# Patient Record
Sex: Female | Born: 1940
Health system: Southern US, Community
[De-identification: ages and names within clinical notes are randomized; demographics above are authoritative.]

## PROBLEM LIST (undated history)

## (undated) DIAGNOSIS — D649 Anemia, unspecified: Secondary | ICD-10-CM

## (undated) DIAGNOSIS — N6019 Diffuse cystic mastopathy of unspecified breast: Secondary | ICD-10-CM

## (undated) DIAGNOSIS — E785 Hyperlipidemia, unspecified: Secondary | ICD-10-CM

## (undated) DIAGNOSIS — D369 Benign neoplasm, unspecified site: Secondary | ICD-10-CM

## (undated) DIAGNOSIS — H269 Unspecified cataract: Secondary | ICD-10-CM

## (undated) HISTORY — DX: Hyperlipidemia, unspecified: E78.5

## (undated) HISTORY — DX: Diffuse cystic mastopathy of unspecified breast: N60.19

## (undated) HISTORY — DX: Benign neoplasm, unspecified site: D36.9

## (undated) HISTORY — PX: ABDOMINAL HYSTERECTOMY: SHX81

## (undated) HISTORY — DX: Anemia, unspecified: D64.9

## (undated) HISTORY — PX: POLYPECTOMY: SHX149

## (undated) HISTORY — PX: BREAST BIOPSY: SHX20

## (undated) HISTORY — PX: EYE SURGERY: SHX253

## (undated) HISTORY — PX: COLONOSCOPY: SHX174

## (undated) HISTORY — DX: Unspecified cataract: H26.9

---

## 2011-12-29 ENCOUNTER — Ambulatory Visit (INDEPENDENT_AMBULATORY_CARE_PROVIDER_SITE_OTHER): Payer: Managed Care, Other (non HMO) | Admitting: Internal Medicine

## 2011-12-29 ENCOUNTER — Encounter: Payer: Self-pay | Admitting: Internal Medicine

## 2011-12-29 VITALS — BP 124/70 | HR 79 | Temp 98.2°F | Ht 63.0 in | Wt 138.0 lb

## 2011-12-29 DIAGNOSIS — Z780 Asymptomatic menopausal state: Secondary | ICD-10-CM

## 2011-12-29 DIAGNOSIS — D369 Benign neoplasm, unspecified site: Secondary | ICD-10-CM | POA: Insufficient documentation

## 2011-12-29 DIAGNOSIS — E785 Hyperlipidemia, unspecified: Secondary | ICD-10-CM | POA: Insufficient documentation

## 2011-12-29 DIAGNOSIS — N951 Menopausal and female climacteric states: Secondary | ICD-10-CM

## 2011-12-29 DIAGNOSIS — N6019 Diffuse cystic mastopathy of unspecified breast: Secondary | ICD-10-CM | POA: Insufficient documentation

## 2011-12-29 DIAGNOSIS — D649 Anemia, unspecified: Secondary | ICD-10-CM | POA: Insufficient documentation

## 2011-12-29 LAB — CBC WITH DIFFERENTIAL/PLATELET
Basophils Relative: 1 % (ref 0–1)
Eosinophils Absolute: 0.1 10*3/uL (ref 0.0–0.7)
Hemoglobin: 12 g/dL (ref 12.0–15.0)
MCH: 29.6 pg (ref 26.0–34.0)
MCHC: 31.7 g/dL (ref 30.0–36.0)
Monocytes Relative: 8 % (ref 3–12)
Neutro Abs: 2.6 10*3/uL (ref 1.7–7.7)
Neutrophils Relative %: 46 % (ref 43–77)
Platelets: 290 10*3/uL (ref 150–400)
RBC: 4.06 MIL/uL (ref 3.87–5.11)

## 2011-12-29 LAB — COMPREHENSIVE METABOLIC PANEL
ALT: 11 U/L (ref 0–35)
AST: 20 U/L (ref 0–37)
Albumin: 4.7 g/dL (ref 3.5–5.2)
Alkaline Phosphatase: 44 U/L (ref 39–117)
Glucose, Bld: 90 mg/dL (ref 70–99)
Potassium: 4.4 mEq/L (ref 3.5–5.3)
Sodium: 139 mEq/L (ref 135–145)
Total Bilirubin: 0.3 mg/dL (ref 0.3–1.2)
Total Protein: 6.8 g/dL (ref 6.0–8.3)

## 2011-12-29 LAB — LIPID PANEL
LDL Cholesterol: 136 mg/dL — ABNORMAL HIGH (ref 0–99)
Triglycerides: 93 mg/dL (ref <150)
VLDL: 19 mg/dL (ref 0–40)

## 2011-12-29 LAB — TSH: TSH: 2.985 u[IU]/mL (ref 0.350–4.500)

## 2011-12-29 NOTE — Patient Instructions (Signed)
Schedule CPE with me labs will be mailed to you

## 2011-12-29 NOTE — Progress Notes (Signed)
  Subjective:    Patient ID: Kirsten Sullivan, female    DOB: August 21, 1941, 71 y.o.   MRN: [id number]  HPI  New pt. Here for first visit.  Moved recently from [facility].  Former care Dr. Montez Morita in [place]  PMH  Mild anemia, hyperlipidemia, fibrocystic breast disease S/P benign breast bx R breast.  She also had a colonoscopy 07/2011 and was found to have adenomatous polyp and will need repeat in 2015.  She is S/P hysterectomy for fibroids.   Overall healthy  No Known Allergies Past Medical History  Diagnosis Date  . Fibrocystic breast   . Anemia   . Hyperlipidemia   . Adenomatous polyps     colonoscopy 07/07/2011 Dr. Hurman Horn, [place]   Past Surgical History  Procedure Date  . Abdominal hysterectomy   . Breast biopsy     right- benign   History   Social History  . Marital Status: Single    Spouse Name: N/A    Number of Children: N/A  . Years of Education: N/A   Occupational History  . Not on file.   Social History Main Topics  . Smoking status: Never Smoker   . Smokeless tobacco: Never Used  . Alcohol Use: No  . Drug Use: No  . Sexually Active: Not Currently   Other Topics Concern  . Not on file   Social History Narrative  . No narrative on file   Family History  Problem Relation Age of Onset  . Alzheimer's disease Mother   . Prostate cancer Father   . Macular degeneration Father   . Gout Maternal Grandmother   . Heart disease Maternal Grandfather 69   Patient Active Problem List  Diagnoses  . Anemia  . Fibrocystic breast  . Hyperlipidemia  . Adenomatous polyps   No current outpatient prescriptions on file prior to visit.     Review of Systems See HPI    Objective:   Physical Exam Physical Exam  Nursing note and vitals reviewed.  Constitutional: She is oriented to person, place, and time. She appears well-developed and well-nourished.  HENT:  Head: Normocephalic and atraumatic.  Cardiovascular: Normal rate and regular rhythm. Exam reveals  no gallop and no friction rub.  No murmur heard.  Pulmonary/Chest: Breath sounds normal. She has no wheezes. She has no rales.  Neurological: She is alert and oriented to person, place, and time.  Skin: Skin is warm and dry.  Psychiatric: She has a normal mood and affect. Her behavior is normal.         Assessment & Plan:  1)  Anemia  Will recheck today 2)   Hyperlipidemia will recheck today 2)  Fibrocystic breast disease 3)  Colon adenomas  She is to schedule CPe  Will check labs today

## 2011-12-30 LAB — VITAMIN D 25 HYDROXY (VIT D DEFICIENCY, FRACTURES): Vit D, 25-Hydroxy: 40 ng/mL (ref 30–89)

## 2012-01-02 ENCOUNTER — Encounter: Payer: Self-pay | Admitting: Emergency Medicine

## 2012-01-15 ENCOUNTER — Encounter: Payer: Self-pay | Admitting: Internal Medicine

## 2012-02-21 ENCOUNTER — Ambulatory Visit (INDEPENDENT_AMBULATORY_CARE_PROVIDER_SITE_OTHER): Payer: Managed Care, Other (non HMO) | Admitting: Internal Medicine

## 2012-02-21 ENCOUNTER — Encounter: Payer: Self-pay | Admitting: Internal Medicine

## 2012-02-21 VITALS — BP 100/57 | HR 70 | Temp 99.2°F | Ht 63.25 in | Wt 139.5 lb

## 2012-02-21 DIAGNOSIS — E785 Hyperlipidemia, unspecified: Secondary | ICD-10-CM

## 2012-02-21 DIAGNOSIS — N951 Menopausal and female climacteric states: Secondary | ICD-10-CM

## 2012-02-21 DIAGNOSIS — Z780 Asymptomatic menopausal state: Secondary | ICD-10-CM

## 2012-02-21 DIAGNOSIS — Z0000 Encounter for general adult medical examination without abnormal findings: Secondary | ICD-10-CM

## 2012-02-21 DIAGNOSIS — I8390 Asymptomatic varicose veins of unspecified lower extremity: Secondary | ICD-10-CM

## 2012-02-21 DIAGNOSIS — Z23 Encounter for immunization: Secondary | ICD-10-CM

## 2012-02-21 LAB — POCT URINALYSIS DIPSTICK
Glucose, UA: NEGATIVE
Nitrite, UA: NEGATIVE
Protein, UA: NEGATIVE
Spec Grav, UA: 1.015
Urobilinogen, UA: 0.2

## 2012-02-21 MED ORDER — TETANUS-DIPHTH-ACELL PERTUSSIS 5-2.5-18.5 LF-MCG/0.5 IM SUSP
0.5000 mL | Freq: Once | INTRAMUSCULAR | Status: AC
  Administered 2012-02-21: 0.5 mL via INTRAMUSCULAR

## 2012-02-21 NOTE — Progress Notes (Signed)
Subjective:    Patient ID: Kirsten Sullivan, female    DOB: Mar 30, 1941, 71 y.o.   MRN: [id number]  HPI  Kirsten Sullivan is here for CPE.  She is doing well.  She does report that she has a bloodshot R eye a few days ago but has resolved now.   Happened upon awakening no pain no visual change.  She has not seen an opthalmolgist here as yet.  No visual problems now  Has not had a Tetanus in many years.    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 Some achiness  No Known Allergies Past Medical History  Diagnosis Date  . Fibrocystic breast   . Anemia   . Hyperlipidemia   . Adenomatous polyps     colonoscopy 07/07/2011 Dr. Hurman Horn[place]   Past Surgical History  Procedure Date  . Abdominal hysterectomy   . Breast biopsy     right- benign   History   Social History  . Marital Status: Single    Spouse Name: N/A    Number of Children: N/A  . Years of Education: N/A   Occupational History  . Not on file.   Social History Main Topics  . Smoking status: Never Smoker   . Smokeless tobacco: Never Used  . Alcohol Use: No  . Drug Use: No  . Sexually Active: Not Currently   Other Topics Concern  . Not on file   Social History Narrative  . No narrative on file   Family History  Problem Relation Age of Onset  . Alzheimer's disease Mother   . Prostate cancer Father   . Macular degeneration Father   . Gout Maternal Grandmother   . Heart disease Maternal Grandfather 34   Patient Active Problem List  Diagnoses  . Anemia  . Fibrocystic breast  . Hyperlipidemia  . Adenomatous polyps   Current Outpatient Prescriptions on File Prior to Visit  Medication Sig Dispense Refill  . Cyanocobalamin (VITAMIN B-12 CR PO) Take 2,500 mcg by mouth daily.      . Multiple Vitamins-Minerals (CENTRUM SILVER PO) Take 1 tablet by mouth daily.      . Omega-3 Fatty Acids (FISH OIL) 1200 MG CAPS Take 1 capsule  by mouth daily.      . Red Yeast Rice 600 MG CAPS Take 1 capsule by mouth 2 (two) times daily.       . vitamin E 1000 UNIT capsule Take 1,000 Units by mouth daily.             Review of Systems  Constitutional: Negative.   HENT: Negative.   Eyes: Negative.   Respiratory: Negative.   Cardiovascular: Negative.   Gastrointestinal: Negative.   Genitourinary: Negative.   Musculoskeletal: Negative.   Skin: Negative.   Neurological: Negative.   Hematological: Negative.   Psychiatric/Behavioral: Negative.        Objective:   Physical Exam Physical Exam  Nursing note and vitals reviewed.  Constitutional: She is oriented to person, place, and time. She appears well-developed and well-nourished.  HENT:  Head: Normocephalic and atraumatic.  Right Ear: Tympanic membrane and ear canal normal. No drainage. Tympanic membrane is not injected and not erythematous.  Left Ear: Tympanic membrane and ear canal normal. No drainage. Tympanic membrane is not injected and not erythematous.  Nose: Nose normal. Right sinus exhibits no maxillary sinus tenderness and no frontal sinus tenderness. Left sinus exhibits no  maxillary sinus tenderness and no frontal sinus tenderness.  Mouth/Throat: Oropharynx is clear and moist. No oral lesions. No oropharyngeal exudate.  Eyes: Conjunctivae and EOM are normal. Pupils are equal, round, and reactive to light.  Neck: Normal range of motion. Neck supple. No JVD present. Carotid bruit is not present. No mass and no thyromegaly present.  Cardiovascular: Normal rate, regular rhythm, S1 normal, S2 normal and intact distal pulses. Exam reveals no gallop and no friction rub.  No murmur heard.  Pulses:  Carotid pulses are 2+ on the right side, and 2+ on the left side.  Dorsalis pedis pulses are 2+ on the right side, and 2+ on the left side.  No carotid bruit. No LE edema  Pulmonary/Chest: Breath sounds normal. She has no wheezes. She has no rales. She exhibits no  tenderness.  Abdominal: Soft. Bowel sounds are normal. She exhibits no distension and no mass. There is no hepatosplenomegaly. There is no tenderness. There is no CVA tenderness.  Pelvic bimanual only  No adnexal masses felt to palpation.  Rectal guaiac neg Musculoskeletal: Normal range of motion.  No active synovitis to joints.  Lymphadenopathy:  She has no cervical adenopathy.  She has no axillary adenopathy.  Right: No inguinal and no supraclavicular adenopathy present.  Left: No inguinal and no supraclavicular adenopathy present.  Neurological: She is alert and oriented to person, place, and time. She has normal strength and normal reflexes. She displays no tremor. No cranial nerve deficit or sensory deficit. Coordination and gait normal.  Skin: Skin is warm and dry. No rash noted. No cyanosis. Nails show no clubbing. Multiple superficial venous varicosities bilaterally Psychiatric: She has a normal mood and affect. Her speech is normal and behavior is normal. Cognition and memory are normal.           Assessment & Plan:  1)  Anemia 2)  Fibrocystic breast diseas  Mammogram due in august 3)  Hypelipidemai 4) Possible subconjunctival hemmorhage  Resolved now.  Gave phone number to Dr. Mateo Flow for pt to schedule appt.   5)  Adenomatous polyps  Need repeat 2015  Will give Tdap today  I spent 45 mintues with this pt

## 2012-02-21 NOTE — Progress Notes (Signed)
Per fall screen, patient denies fall in last 6 months 

## 2012-02-21 NOTE — Patient Instructions (Signed)
To have bone density Office viistg as needed

## 2012-03-11 ENCOUNTER — Encounter: Payer: Self-pay | Admitting: Internal Medicine

## 2012-03-11 DIAGNOSIS — Z90710 Acquired absence of both cervix and uterus: Secondary | ICD-10-CM | POA: Insufficient documentation

## 2012-03-12 ENCOUNTER — Ambulatory Visit
Admission: RE | Admit: 2012-03-12 | Discharge: 2012-03-12 | Disposition: A | Discharge status: Home / Self Care | Payer: Managed Care, Other (non HMO) | Source: Ambulatory Visit | Attending: Internal Medicine | Admitting: Internal Medicine

## 2012-03-12 DIAGNOSIS — Z780 Asymptomatic menopausal state: Secondary | ICD-10-CM

## 2012-03-15 ENCOUNTER — Telehealth: Payer: Self-pay | Admitting: *Deleted

## 2012-03-15 NOTE — Telephone Encounter (Signed)
LM on pt's voice mail that her bone density was normal.

## 2012-04-16 ENCOUNTER — Encounter: Payer: Self-pay | Admitting: *Deleted

## 2012-04-17 ENCOUNTER — Encounter: Payer: Self-pay | Admitting: *Deleted

## 2012-05-02 ENCOUNTER — Telehealth: Payer: Self-pay | Admitting: Internal Medicine

## 2012-05-02 NOTE — Telephone Encounter (Signed)
Pt states she clled two weeks ago about Dr. Constance Goltz getting her Mammogram and xrays... She have not heard anything back... She would like to know if you have received them... And if she can go ahead and have her mammagram done in September (she needs it done)... If there are any questions or concerns please call pt at [phone]... Thank

## 2012-05-02 NOTE — Telephone Encounter (Signed)
Mervyn Gay  I did get her old records but not her mammogram report.  [place] she can have mammogram done at anytime but she should check with [employer].  Please go ahead and order a screening mammogram on her

## 2012-05-03 ENCOUNTER — Telehealth: Payer: Self-pay | Admitting: *Deleted

## 2012-05-03 DIAGNOSIS — Z0000 Encounter for general adult medical examination without abnormal findings: Secondary | ICD-10-CM

## 2012-05-03 NOTE — Telephone Encounter (Signed)
Order put in for pt's screening bilat mammogram @ [facility].

## 2012-07-02 ENCOUNTER — Ambulatory Visit (HOSPITAL_BASED_OUTPATIENT_CLINIC_OR_DEPARTMENT_OTHER)
Admission: RE | Admit: 2012-07-02 | Discharge: 2012-07-02 | Disposition: A | Discharge status: Home / Self Care | Payer: Managed Care, Other (non HMO) | Source: Ambulatory Visit | Attending: Internal Medicine | Admitting: Internal Medicine

## 2012-07-02 DIAGNOSIS — Z1231 Encounter for screening mammogram for malignant neoplasm of breast: Secondary | ICD-10-CM | POA: Insufficient documentation

## 2012-07-02 DIAGNOSIS — Z0000 Encounter for general adult medical examination without abnormal findings: Secondary | ICD-10-CM

## 2012-07-10 ENCOUNTER — Telehealth: Payer: Self-pay | Admitting: *Deleted

## 2012-07-10 NOTE — Telephone Encounter (Signed)
Message regarding flu shot rescheduling

## 2012-07-11 ENCOUNTER — Telehealth (INDEPENDENT_AMBULATORY_CARE_PROVIDER_SITE_OTHER): Payer: Managed Care, Other (non HMO) | Admitting: *Deleted

## 2012-07-11 ENCOUNTER — Ambulatory Visit: Payer: Managed Care, Other (non HMO)

## 2012-07-11 DIAGNOSIS — Z23 Encounter for immunization: Secondary | ICD-10-CM

## 2012-07-12 NOTE — Telephone Encounter (Signed)
Pr received her flu vaccine

## 2013-03-23 IMAGING — MG MM DIGITAL SCREENING BILAT
4 series · 4 of 4 positions shown · non-contrast
Comparison: None.

CLINICAL DATA: Screening.

DIGITAL BILATERAL SCREENING MAMMOGRAM WITH CAD

[R CC]
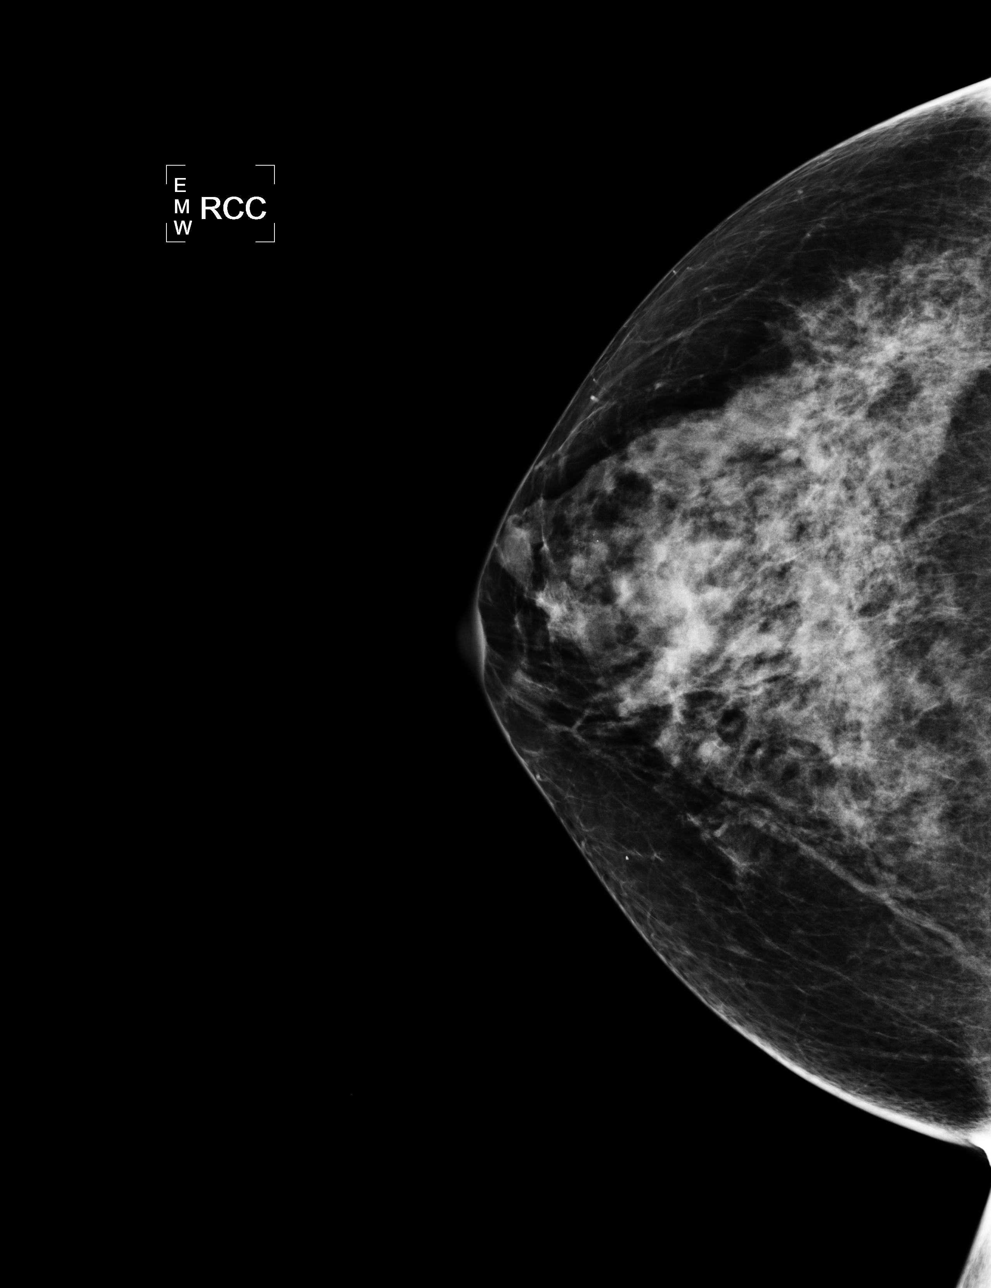

[L CC]
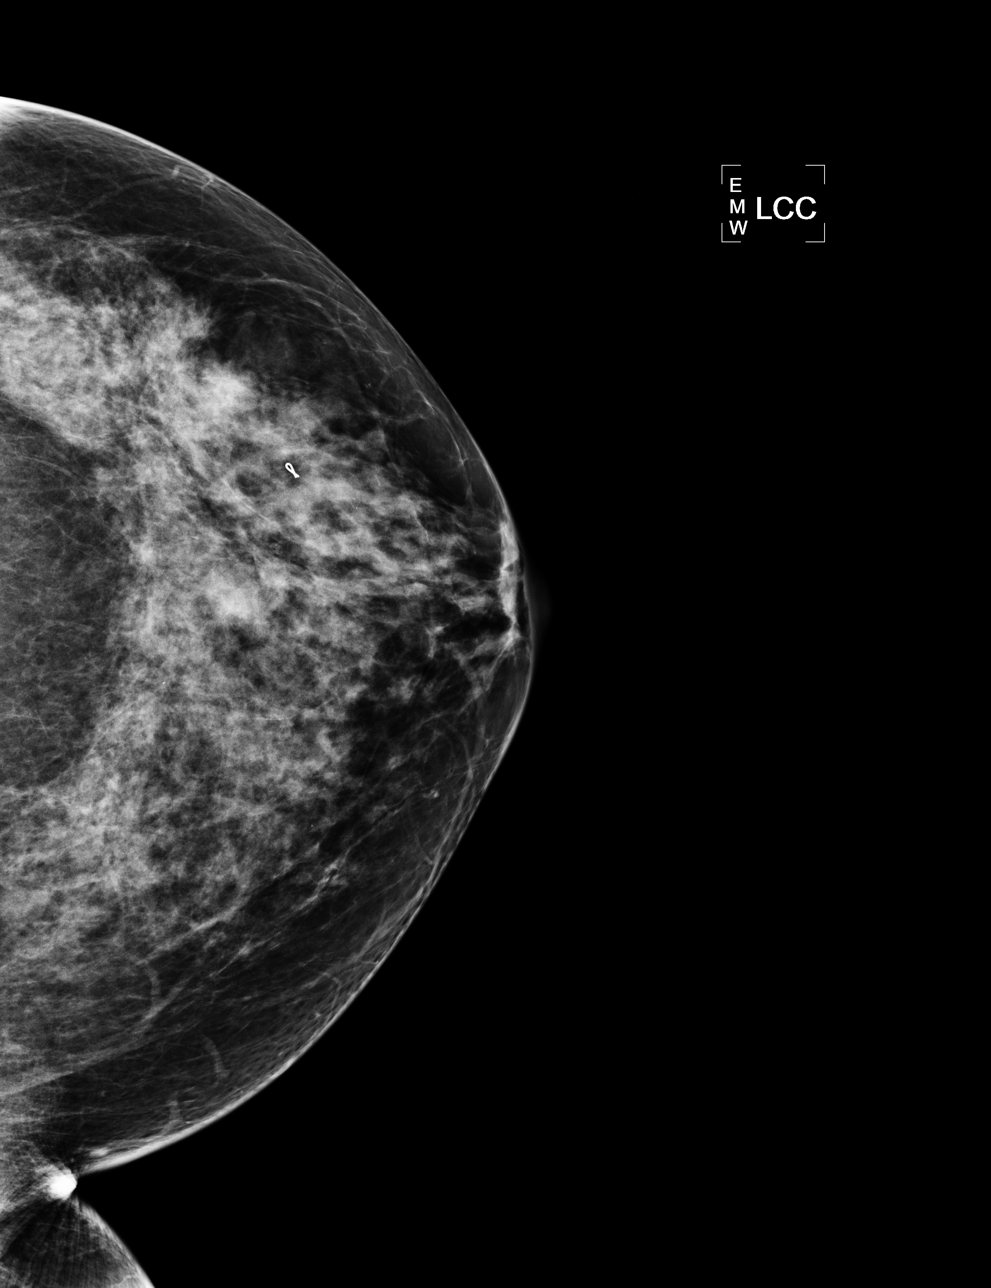

[L MLO]
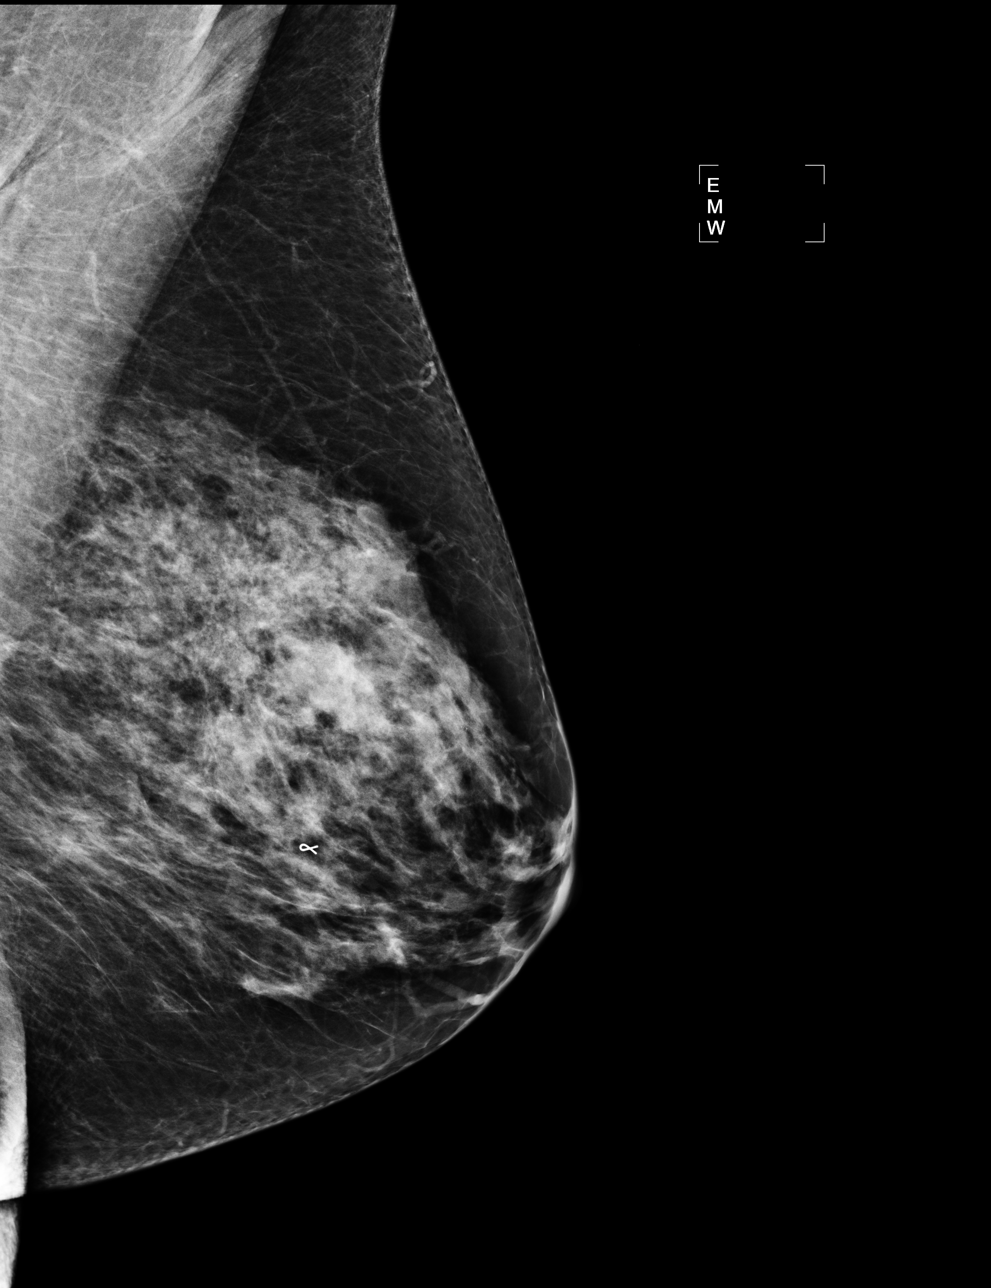

[R MLO]
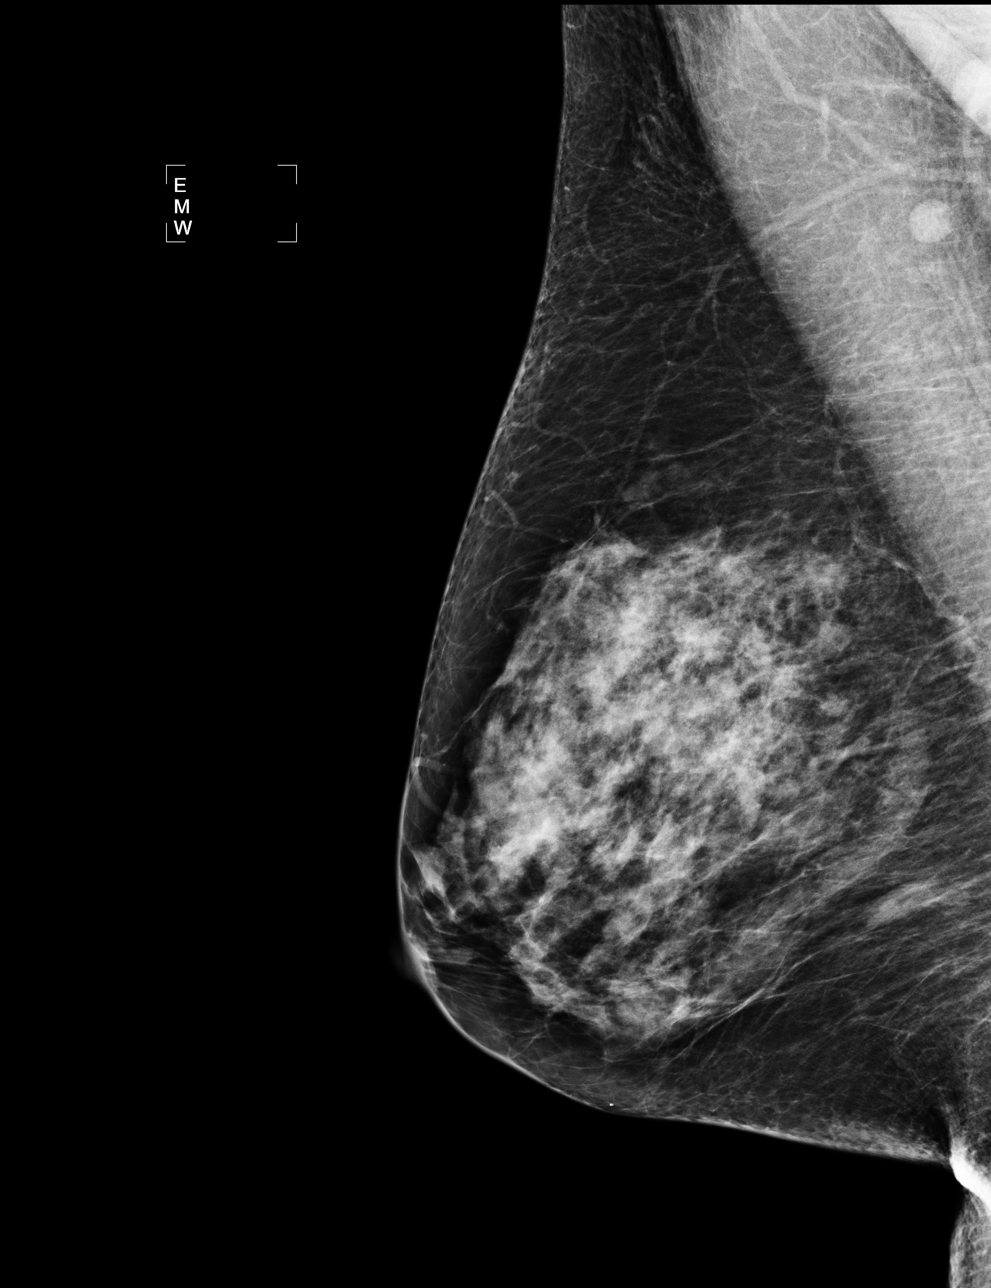

[4 of 4 positions shown; findings below may reference images not displayed]

FINDINGS: The breast tissue is heterogeneously dense. No suspicious
masses, architectural distortion, or calcifications are present.

Images were processed with CAD.
IMPRESSION: No mammographic evidence of malignancy.

A result letter of this screening mammogram will be mailed directly
to the patient.

RECOMMENATION:
Screening mammogram in one year. (Code:IH-B-SE1)

BI-RADS CATEGORY 1:  Negative.

## 2013-05-08 LAB — CBC
HCT: 35.6 % — ABNORMAL LOW (ref 36.0–46.0)
MCH: 29.9 pg (ref 26.0–34.0)
MCHC: 33.1 g/dL (ref 30.0–36.0)
MCV: 90.4 fL (ref 78.0–100.0)
Platelets: 292 10*3/uL (ref 150–400)
RDW: 13.9 % (ref 11.5–15.5)
WBC: 4.9 10*3/uL (ref 4.0–10.5)

## 2013-05-08 LAB — LIPID PANEL
HDL: 69 mg/dL (ref >39)
LDL Cholesterol: 138 mg/dL — ABNORMAL HIGH (ref 0–99)
Total CHOL/HDL Ratio: 3.2 Ratio
Triglycerides: 59 mg/dL (ref <150)

## 2013-05-08 LAB — COMPREHENSIVE METABOLIC PANEL
ALT: 16 U/L (ref 0–35)
AST: 17 U/L (ref 0–37)
Alkaline Phosphatase: 41 U/L (ref 39–117)
BUN: 22 mg/dL (ref 6–23)
Calcium: 8.9 mg/dL (ref 8.4–10.5)
Chloride: 103 mEq/L (ref 96–112)
Creat: 0.84 mg/dL (ref 0.50–1.10)
Total Bilirubin: 0.4 mg/dL (ref 0.3–1.2)

## 2013-05-09 ENCOUNTER — Encounter: Payer: Self-pay | Admitting: *Deleted

## 2013-05-09 LAB — VITAMIN D 25 HYDROXY (VIT D DEFICIENCY, FRACTURES): Vit D, 25-Hydroxy: 57 ng/mL (ref 30–89)

## 2013-05-13 ENCOUNTER — Encounter: Payer: Self-pay | Admitting: *Deleted

## 2013-05-16 ENCOUNTER — Encounter: Payer: Managed Care, Other (non HMO) | Admitting: Internal Medicine

## 2013-05-16 ENCOUNTER — Encounter: Payer: Self-pay | Admitting: Internal Medicine

## 2013-05-16 ENCOUNTER — Ambulatory Visit (INDEPENDENT_AMBULATORY_CARE_PROVIDER_SITE_OTHER): Payer: Medicare HMO | Admitting: Internal Medicine

## 2013-05-16 VITALS — BP 120/70 | HR 72 | Temp 98.5°F | Resp 18 | Ht 63.0 in | Wt 143.0 lb

## 2013-05-16 DIAGNOSIS — E785 Hyperlipidemia, unspecified: Secondary | ICD-10-CM

## 2013-05-16 DIAGNOSIS — D369 Benign neoplasm, unspecified site: Secondary | ICD-10-CM

## 2013-05-16 DIAGNOSIS — Z0000 Encounter for general adult medical examination without abnormal findings: Secondary | ICD-10-CM

## 2013-05-16 DIAGNOSIS — Z139 Encounter for screening, unspecified: Secondary | ICD-10-CM

## 2013-05-16 DIAGNOSIS — Z9289 Personal history of other medical treatment: Secondary | ICD-10-CM | POA: Insufficient documentation

## 2013-05-16 DIAGNOSIS — Z9189 Other specified personal risk factors, not elsewhere classified: Secondary | ICD-10-CM

## 2013-05-16 DIAGNOSIS — Z23 Encounter for immunization: Secondary | ICD-10-CM

## 2013-05-16 LAB — POCT URINALYSIS DIPSTICK
Bilirubin, UA: NEGATIVE
Blood, UA: NEGATIVE
Leukocytes, UA: NEGATIVE
Nitrite, UA: NEGATIVE
Protein, UA: NEGATIVE
Urobilinogen, UA: NEGATIVE
pH, UA: 7

## 2013-05-16 NOTE — Progress Notes (Signed)
Subjective:    Patient ID: Kirsten Sullivan, female    DOB: 1940-12-17, 72 y.o.   MRN: [id number]  HPI Kirsten Sullivan is here for CPE  Doing well no chest pain no SOB  .   She has seen Dr. Elmer Picker who is checking her for glaucoma  MM due after 9/30  No Known Allergies Past Medical History  Diagnosis Date  . Fibrocystic breast   . Anemia   . Hyperlipidemia   . Adenomatous polyps     colonoscopy 07/07/2011 Dr. Hurman [place]   Past Surgical History  Procedure Laterality Date  . Abdominal hysterectomy    . Breast biopsy      right- benign   History   Social History  . Marital Status: Single    Spouse Name: N/A    Number of Children: N/A  . Years of Education: N/A   Occupational History  . Not on file.   Social History Main Topics  . Smoking status: Never Smoker   . Smokeless tobacco: Never Used  . Alcohol Use: No  . Drug Use: No  . Sexual Activity: Not Currently   Other Topics Concern  . Not on file   Social History Narrative  . No narrative on file   Family History  Problem Relation Age of Onset  . Alzheimer's disease Mother   . Prostate cancer Father   . Macular degeneration Father   . Gout Maternal Grandmother   . Heart disease Maternal Grandfather 86   Patient Active Problem List   Diagnosis Date Noted  . H/O bone density study 05/16/2013  . H/O hysterectomy for benign disease 03/11/2012  . Anemia   . Fibrocystic breast   . Hyperlipidemia   . Adenomatous polyps    Current Outpatient Prescriptions on File Prior to Visit  Medication Sig Dispense Refill  . Cyanocobalamin (VITAMIN B-12 CR PO) Take 2,500 mcg by mouth daily.      . Multiple Vitamins-Minerals (CENTRUM SILVER PO) Take 1 tablet by mouth daily.      . Omega-3 Fatty Acids (FISH OIL) 1200 MG CAPS Take 1 capsule by mouth daily.      . Red Yeast Rice 600 MG CAPS Take 1 capsule by mouth 2 (two) times daily.       . vitamin E 1000 UNIT capsule Take 1,000 Units by mouth daily.       No  current facility-administered medications on file prior to visit.      Review of Systems  All other systems reviewed and are negative.       Objective:   Physical Exam Physical Exam  Nursing note and vitals reviewed.  Constitutional: She is oriented to person, place, and time. She appears well-developed and well-nourished.  HENT:  Head: Normocephalic and atraumatic.  Right Ear: Tympanic membrane and ear canal normal. No drainage. Tympanic membrane is not injected and not erythematous.  Left Ear: Tympanic membrane and ear canal normal. No drainage. Tympanic membrane is not injected and not erythematous.  Nose: Nose normal. Right sinus exhibits no maxillary sinus tenderness and no frontal sinus tenderness. Left sinus exhibits no maxillary sinus tenderness and no frontal sinus tenderness.  Mouth/Throat: Oropharynx is clear and moist. No oral lesions. No oropharyngeal exudate.  Eyes: Conjunctivae and EOM are normal. Pupils are equal, round, and reactive to light.  Neck: Normal range of motion. Neck supple. No JVD present. Carotid bruit is not present. No mass and no thyromegaly present.  Cardiovascular: Normal rate, regular rhythm, S1 normal,  S2 normal and intact distal pulses. Exam reveals no gallop and no friction rub.  No murmur heard.  Pulses:  Carotid pulses are 2+ on the right side, and 2+ on the left side.  Dorsalis pedis pulses are 2+ on the right side, and 2+ on the left side.  No carotid bruit. No LE edema  Pulmonary/Chest: Breath sounds normal. She has no wheezes. She has no rales. She exhibits no tenderness.  Breast  No discrete masses no nipple discharge no axillary adenopathy bilaterally Abdominal: Soft. Bowel sounds are normal. She exhibits no distension and no mass. There is no hepatosplenomegaly. There is no tenderness. There is no CVA tenderness.   Rectal no mass guaiac neg Musculoskeletal: Normal range of motion.  No active synovitis to joints.  Lymphadenopathy:  She  has no cervical adenopathy.  She has no axillary adenopathy.  Right: No inguinal and no supraclavicular adenopathy present.  Left: No inguinal and no supraclavicular adenopathy present.  Neurological: She is alert and oriented to person, place, and time. She has normal strength and normal reflexes. She displays no tremor. No cranial nerve deficit or sensory deficit. Coordination and gait normal.  Skin: Skin is warm and dry. No rash noted. No cyanosis. Nails show no clubbing.  Psychiatric: She has a normal mood and affect. Her speech is normal and behavior is normal. Cognition and memory are normal.          Assessment & Plan:  Health Maintenance:  Will give pneumovax booster today.  Advised 3-D mm in October and influenza vaccine in October  Colon polyps  Due for colonoscopy in 2015  Hyperlipidemia  LDL 138 but good HDL  She does not wish statin at this point  Will check once a year,  If increases will consider statin.     History of fibrocystic breast disease

## 2013-05-16 NOTE — Addendum Note (Signed)
Addended by: Mathews Robinsons on: 05/16/2013 03:20 PM   Modules accepted: Orders

## 2013-05-16 NOTE — Patient Instructions (Addendum)
Call for flu vaccine in october

## 2013-06-05 ENCOUNTER — Other Ambulatory Visit: Payer: Self-pay

## 2013-06-05 DIAGNOSIS — Z1231 Encounter for screening mammogram for malignant neoplasm of breast: Secondary | ICD-10-CM

## 2013-07-03 ENCOUNTER — Ambulatory Visit
Admission: RE | Admit: 2013-07-03 | Discharge: 2013-07-03 | Disposition: A | Discharge status: Home / Self Care | Payer: Medicare HMO | Source: Ambulatory Visit

## 2013-07-03 DIAGNOSIS — Z1231 Encounter for screening mammogram for malignant neoplasm of breast: Secondary | ICD-10-CM

## 2013-07-05 ENCOUNTER — Ambulatory Visit (INDEPENDENT_AMBULATORY_CARE_PROVIDER_SITE_OTHER): Payer: Medicare HMO | Admitting: *Deleted

## 2013-07-05 ENCOUNTER — Ambulatory Visit: Payer: Medicare HMO

## 2013-07-05 DIAGNOSIS — Z23 Encounter for immunization: Secondary | ICD-10-CM

## 2014-01-07 ENCOUNTER — Other Ambulatory Visit: Payer: Self-pay | Admitting: *Deleted

## 2014-01-07 NOTE — Telephone Encounter (Signed)
error 

## 2014-06-04 ENCOUNTER — Telehealth: Payer: Self-pay | Admitting: *Deleted

## 2014-06-04 NOTE — Telephone Encounter (Signed)
Needs an order for her annual Mammogram put in; so she can make appt at [facility].

## 2014-06-11 ENCOUNTER — Other Ambulatory Visit: Payer: Self-pay | Admitting: Internal Medicine

## 2014-06-11 DIAGNOSIS — Z139 Encounter for screening, unspecified: Secondary | ICD-10-CM

## 2014-06-12 NOTE — Progress Notes (Signed)
Called Tzirel and [facility] had [facility] and made an appointment

## 2014-07-07 ENCOUNTER — Ambulatory Visit (HOSPITAL_BASED_OUTPATIENT_CLINIC_OR_DEPARTMENT_OTHER)
Admission: RE | Admit: 2014-07-07 | Discharge: 2014-07-07 | Disposition: A | Discharge status: Home / Self Care | Payer: MEDICARE | Source: Ambulatory Visit | Attending: Internal Medicine | Admitting: Internal Medicine

## 2014-07-07 DIAGNOSIS — Z1231 Encounter for screening mammogram for malignant neoplasm of breast: Secondary | ICD-10-CM | POA: Insufficient documentation

## 2014-07-07 DIAGNOSIS — Z139 Encounter for screening, unspecified: Secondary | ICD-10-CM

## 2014-07-25 ENCOUNTER — Ambulatory Visit (INDEPENDENT_AMBULATORY_CARE_PROVIDER_SITE_OTHER): Payer: MEDICARE | Admitting: *Deleted

## 2014-07-25 ENCOUNTER — Other Ambulatory Visit: Payer: Self-pay | Admitting: *Deleted

## 2014-07-25 DIAGNOSIS — Z23 Encounter for immunization: Secondary | ICD-10-CM

## 2014-07-25 DIAGNOSIS — Z0000 Encounter for general adult medical examination without abnormal findings: Secondary | ICD-10-CM

## 2014-07-25 DIAGNOSIS — E559 Vitamin D deficiency, unspecified: Secondary | ICD-10-CM

## 2014-07-25 LAB — COMPLETE METABOLIC PANEL WITH GFR
ALBUMIN: 4.4 g/dL (ref 3.5–5.2)
ALT: 14 U/L (ref 0–35)
AST: 17 U/L (ref 0–37)
Alkaline Phosphatase: 39 U/L (ref 39–117)
BUN: 18 mg/dL (ref 6–23)
CHLORIDE: 103 meq/L (ref 96–112)
CO2: 27 meq/L (ref 19–32)
Calcium: 9.2 mg/dL (ref 8.4–10.5)
Creat: 0.87 mg/dL (ref 0.50–1.10)
GFR, EST AFRICAN AMERICAN: 78 mL/min
GFR, EST NON AFRICAN AMERICAN: 67 mL/min
GLUCOSE: 81 mg/dL (ref 70–99)
POTASSIUM: 4.2 meq/L (ref 3.5–5.3)
SODIUM: 139 meq/L (ref 135–145)
TOTAL PROTEIN: 6.5 g/dL (ref 6.0–8.3)
Total Bilirubin: 0.5 mg/dL (ref 0.2–1.2)

## 2014-07-25 LAB — LIPID PANEL
CHOL/HDL RATIO: 3.2 ratio
Cholesterol: 231 mg/dL — ABNORMAL HIGH (ref 0–200)
HDL: 72 mg/dL (ref >39)
LDL Cholesterol: 142 mg/dL — ABNORMAL HIGH (ref 0–99)
Triglycerides: 85 mg/dL (ref <150)
VLDL: 17 mg/dL (ref 0–40)

## 2014-07-25 LAB — CBC WITH DIFFERENTIAL/PLATELET
Basophils Absolute: 0 10*3/uL (ref 0.0–0.1)
Basophils Relative: 0 % (ref 0–1)
Eosinophils Absolute: 0 10*3/uL (ref 0.0–0.7)
Eosinophils Relative: 1 % (ref 0–5)
HCT: 36.2 % (ref 36.0–46.0)
HEMOGLOBIN: 11.8 g/dL — AB (ref 12.0–15.0)
LYMPHS ABS: 2.1 10*3/uL (ref 0.7–4.0)
Lymphocytes Relative: 43 % (ref 12–46)
MCH: 29.7 pg (ref 26.0–34.0)
MCHC: 32.6 g/dL (ref 30.0–36.0)
MCV: 91.2 fL (ref 78.0–100.0)
MONOS PCT: 6 % (ref 3–12)
Monocytes Absolute: 0.3 10*3/uL (ref 0.1–1.0)
NEUTROS ABS: 2.4 10*3/uL (ref 1.7–7.7)
Neutrophils Relative %: 50 % (ref 43–77)
Platelets: 257 10*3/uL (ref 150–400)
RBC: 3.97 MIL/uL (ref 3.87–5.11)
RDW: 13.7 % (ref 11.5–15.5)
WBC: 4.8 10*3/uL (ref 4.0–10.5)

## 2014-07-26 LAB — VITAMIN D 25 HYDROXY (VIT D DEFICIENCY, FRACTURES): VIT D 25 HYDROXY: 59 ng/mL (ref 30–89)

## 2014-07-26 LAB — TSH: TSH: 1.617 u[IU]/mL (ref 0.350–4.500)

## 2014-07-28 NOTE — Progress Notes (Signed)
I mailed a copy of the labs to patient with a note reminder her to keep her next apt with us.-eh

## 2014-08-06 ENCOUNTER — Encounter: Payer: Self-pay | Admitting: Internal Medicine

## 2014-08-06 ENCOUNTER — Ambulatory Visit (INDEPENDENT_AMBULATORY_CARE_PROVIDER_SITE_OTHER): Payer: MEDICARE | Admitting: Internal Medicine

## 2014-08-06 VITALS — BP 125/66 | HR 84 | Temp 98.3°F | Resp 16 | Ht 63.0 in | Wt 141.0 lb

## 2014-08-06 DIAGNOSIS — D649 Anemia, unspecified: Secondary | ICD-10-CM

## 2014-08-06 DIAGNOSIS — Z0000 Encounter for general adult medical examination without abnormal findings: Secondary | ICD-10-CM

## 2014-08-06 DIAGNOSIS — K635 Polyp of colon: Secondary | ICD-10-CM

## 2014-08-06 DIAGNOSIS — Z0001 Encounter for general adult medical examination with abnormal findings: Secondary | ICD-10-CM

## 2014-08-06 DIAGNOSIS — E785 Hyperlipidemia, unspecified: Secondary | ICD-10-CM

## 2014-08-06 LAB — POCT URINALYSIS DIPSTICK
Bilirubin, UA: NEGATIVE
Blood, UA: NEGATIVE
GLUCOSE UA: NEGATIVE
Ketones, UA: NEGATIVE
Nitrite, UA: NEGATIVE
Protein, UA: NEGATIVE
Spec Grav, UA: 1.02
Urobilinogen, UA: NEGATIVE
pH, UA: 6.5

## 2014-08-06 LAB — HEMOCCULT GUIAC POC 1CARD (OFFICE): Fecal Occult Blood, POC: NEGATIVE

## 2014-08-06 MED ORDER — SIMVASTATIN 5 MG PO TABS: ORAL_TABLET | ORAL | Status: DC

## 2014-08-06 NOTE — Patient Instructions (Signed)
Will refer to [facility] for colonscopy  Schedule appt for bone density    See me in 8 weeks  For cholesterol med  To pharmacy today

## 2014-08-06 NOTE — Progress Notes (Signed)
Subjective:    Patient ID: Kirsten Sullivan, female    DOB: May 09, 1941, 73 y.o.   MRN: [id number]  HPI  05/2013 Health Maintenance: Will give pneumovax booster today. Advised 3-D mm in October and influenza vaccine in October  Colon polyps Due for colonoscopy in 2015  Hyperlipidemia LDL 138 but good HDL She does not wish statin at this point Will check once a year, If increases will consider statin.    History of fibrocystic breast disease     Today  Kirsten Sullivan is here For CPE  HM:  UTD with mm,  She is due for DEXA and colonoscopy  .  S/P hysterectomy for fibroid.  She isa non-smoker   Hyperlipidemia  She has been taking Red yeast rice but ldl higher.  Problem list and meds reviewed   No Known Allergies Past Medical History  Diagnosis Date  . Fibrocystic breast   . Anemia   . Hyperlipidemia   . Adenomatous polyps     colonoscopy 07/07/2011 Dr. Marcelle Smiling, [place]   Past Surgical History  Procedure Laterality Date  . Abdominal hysterectomy    . Breast biopsy      right- benign   History   Social History  . Marital Status: Single    Spouse Name: N/A    Number of Children: N/A  . Years of Education: N/A   Occupational History  . Not on file.   Social History Main Topics  . Smoking status: Never Smoker   . Smokeless tobacco: Never Used  . Alcohol Use: No  . Drug Use: No  . Sexual Activity: Not Currently   Other Topics Concern  . Not on file   Social History Narrative  . No narrative on file   Family History  Problem Relation Age of Onset  . Alzheimer's disease Mother   . Prostate cancer Father   . Macular degeneration Father   . Gout Maternal Grandmother   . Heart disease Maternal Grandfather 65   Patient Active Problem List   Diagnosis Date Noted  . H/O bone density study 05/16/2013  . H/O hysterectomy for benign disease 03/11/2012  . Anemia   . Fibrocystic breast   . Hyperlipidemia   . Adenomatous polyps    Current  Outpatient Prescriptions on File Prior to Visit  Medication Sig Dispense Refill  . calcium carbonate (OS-CAL - DOSED IN MG OF ELEMENTAL CALCIUM) 1250 MG tablet Take 1 tablet by mouth.    . Cyanocobalamin (VITAMIN B-12 CR PO) Take 2,500 mcg by mouth daily.    . Multiple Vitamins-Minerals (CENTRUM SILVER PO) Take 1 tablet by mouth daily.    . Omega-3 Fatty Acids (FISH OIL) 1200 MG CAPS Take 1 capsule by mouth daily.    . Red Yeast Rice 600 MG CAPS Take 1 capsule by mouth 2 (two) times daily.     . vitamin C (ASCORBIC ACID) 500 MG tablet Take 500 mg by mouth daily.    . vitamin E 1000 UNIT capsule Take 1,000 Units by mouth daily.     No current facility-administered medications on file prior to visit.      Review of Systems See HPI    Objective:   Physical Exam Physical Exam  Nursing note and vitals reviewed.  Constitutional: She is oriented to person, place, and time. She appears well-developed and well-nourished.  HENT:  Head: Normocephalic and atraumatic.  Right Ear: Tympanic membrane and ear canal normal. No drainage. Tympanic membrane is not injected and not erythematous.  Left Ear: Tympanic membrane and ear canal normal. No drainage. Tympanic membrane is not injected and not erythematous.  Nose: Nose normal. Right sinus exhibits no maxillary sinus tenderness and no frontal sinus tenderness. Left sinus exhibits no maxillary sinus tenderness and no frontal sinus tenderness.  Mouth/Throat: Oropharynx is clear and moist. No oral lesions. No oropharyngeal exudate.  Eyes: Conjunctivae and EOM are normal. Pupils are equal, round, and reactive to light.  Neck: Normal range of motion. Neck supple. No JVD present. Carotid bruit is not present. No mass and no thyromegaly present.  Cardiovascular: Normal rate, regular rhythm, S1 normal, S2 normal and intact distal pulses. Exam reveals no gallop and no friction rub.  No murmur heard.  Pulses:  Carotid pulses are 2+ on the right side, and 2+  on the left side.  Dorsalis pedis pulses are 2+ on the right side, and 2+ on the left side.  No carotid bruit. No LE edema  Pulmonary/Chest: Breath sounds normal. She has no wheezes. She has no rales. She exhibits no tenderness. Breast no discrete mass no nipple discharge no axillary adenopathy bilaterally Abdominal: Soft. Bowel sounds are normal. She exhibits no distension and no mass. There is no hepatosplenomegaly. There is no tenderness. There is no CVA tenderness.   Rectal no mass guaiac neg Musculoskeletal: Normal range of motion.  No active synovitis to joints.  Lymphadenopathy:  She has no cervical adenopathy.  She has no axillary adenopathy.  Right: No inguinal and no supraclavicular adenopathy present.  Left: No inguinal and no supraclavicular adenopathy present.  Neurological: She is alert and oriented to person, place, and time. She has normal strength and normal reflexes. She displays no tremor. No cranial nerve deficit or sensory deficit. Coordination and gait normal.  Skin: Skin is warm and dry. No rash noted. No cyanosis. Nails show no clubbing.  Psychiatric: She has a normal mood and affect. Her speech is normal and behavior is normal. Cognition and memory are normal.        Assessment & Plan:  HM  Will order Prevnar  Refer for colonoscopy,   Order DEXA     Had flu vaccine  Hyperlipidemia pt wishes to try Zocor 3 times a week  Will give 5 mg   Borderline anemia  This is chronic  Increase FE in diet  Colon polyp  See above    See me in 6-8 weeks to check lfts

## 2014-08-18 ENCOUNTER — Ambulatory Visit (INDEPENDENT_AMBULATORY_CARE_PROVIDER_SITE_OTHER): Payer: MEDICARE | Admitting: *Deleted

## 2014-08-18 DIAGNOSIS — Z23 Encounter for immunization: Secondary | ICD-10-CM

## 2014-09-03 ENCOUNTER — Other Ambulatory Visit: Payer: Self-pay | Admitting: *Deleted

## 2014-09-03 DIAGNOSIS — Z780 Asymptomatic menopausal state: Secondary | ICD-10-CM

## 2014-09-12 ENCOUNTER — Telehealth: Payer: Self-pay

## 2014-09-12 NOTE — Telephone Encounter (Signed)
Rec'd from [facility] for Endoscopy forward 9 pages to Historical Provider

## 2014-09-12 NOTE — Telephone Encounter (Signed)
Rec'd from [facility] for Endo forward 9 pages to Historical Provide

## 2014-09-17 ENCOUNTER — Encounter: Payer: Self-pay | Admitting: Internal Medicine

## 2014-09-17 ENCOUNTER — Ambulatory Visit (INDEPENDENT_AMBULATORY_CARE_PROVIDER_SITE_OTHER): Payer: MEDICARE | Admitting: Internal Medicine

## 2014-09-17 VITALS — BP 115/56 | HR 73 | Resp 16 | Wt 142.0 lb

## 2014-09-17 DIAGNOSIS — Z139 Encounter for screening, unspecified: Secondary | ICD-10-CM

## 2014-09-17 DIAGNOSIS — E785 Hyperlipidemia, unspecified: Secondary | ICD-10-CM

## 2014-09-17 DIAGNOSIS — E2839 Other primary ovarian failure: Secondary | ICD-10-CM

## 2014-09-17 LAB — HEPATIC FUNCTION PANEL
ALT: 14 U/L (ref 0–35)
AST: 17 U/L (ref 0–37)
Albumin: 4.2 g/dL (ref 3.5–5.2)
Alkaline Phosphatase: 41 U/L (ref 39–117)
BILIRUBIN TOTAL: 0.3 mg/dL (ref 0.2–1.2)
Bilirubin, Direct: 0.1 mg/dL (ref 0.0–0.3)
Indirect Bilirubin: 0.2 mg/dL (ref 0.2–1.2)
TOTAL PROTEIN: 6.5 g/dL (ref 6.0–8.3)

## 2014-09-17 LAB — LIPID PANEL
CHOLESTEROL: 192 mg/dL (ref 0–200)
HDL: 69 mg/dL (ref >39)
LDL Cholesterol: 104 mg/dL — ABNORMAL HIGH (ref 0–99)
Total CHOL/HDL Ratio: 2.8 Ratio
Triglycerides: 94 mg/dL (ref <150)
VLDL: 19 mg/dL (ref 0–40)

## 2014-09-17 NOTE — Progress Notes (Signed)
Subjective:    Patient ID: Kirsten Sullivan, female    DOB: September 17, 1941, 73 y.o.   MRN: [id number]  HPI  11/4 HM Will order Prevnar Refer for colonoscopy, Order DEXA Had flu vaccine  Hyperlipidemia pt wishes to try Zocor 3 times a week Will give 5 mg   Borderline anemia This is chronic Increase FE in diet  Colon polyp See above   See me in 6-8 weeks to check lfts  TODAY  Lekia is here to follow up after initiating low dose statin 3 times a week    No myalgias  Feeling fine    No Known Allergies Past Medical History  Diagnosis Date  . Fibrocystic breast   . Anemia   . Hyperlipidemia   . Adenomatous polyps     colonoscopy 07/07/2011 Dr. Marcelle Sm[place]   Past Surgical History  Procedure Laterality Date  . Abdominal hysterectomy    . Breast biopsy      right- benign   History   Social History  . Marital Status: Single    Spouse Name: N/A    Number of Children: N/A  . Years of Education: N/A   Occupational History  . Not on file.   Social History Main Topics  . Smoking status: Never Smoker   . Smokeless tobacco: Never Used  . Alcohol Use: No  . Drug Use: No  . Sexual Activity: Not Currently   Other Topics Concern  . Not on file   Social History Narrative   Family History  Problem Relation Age of Onset  . Alzheimer's disease Mother   . Prostate cancer Father   . Macular degeneration Father   . Gout Maternal Grandmother   . Heart disease Maternal Grandfather 65   Patient Active Problem List   Diagnosis Date Noted  . H/O bone density study 05/16/2013  . H/O hysterectomy for benign disease 03/11/2012  . Anemia   . Fibrocystic breast   . Hyperlipidemia   . Adenomatous polyps    Current Outpatient Prescriptions on File Prior to Visit  Medication Sig Dispense Refill  . aspirin 81 MG tablet Take 81 mg by mouth daily.    . calcium carbonate (OS-CAL - DOSED IN MG OF ELEMENTAL CALCIUM) 1250 MG tablet Take 1 tablet by mouth.      . Cyanocobalamin (VITAMIN B-12 CR PO) Take 2,500 mcg by mouth daily.    . Multiple Vitamins-Minerals (CENTRUM SILVER PO) Take 1 tablet by mouth daily.    . Omega-3 Fatty Acids (FISH OIL) 1200 MG CAPS Take 1 capsule by mouth daily.    . Red Yeast Rice 600 MG CAPS Take 1 capsule by mouth 2 (two) times daily.     . simvastatin (ZOCOR) 5 MG tablet Take one tablet Mon,Weds, Friday 12 tablet 1  . vitamin C (ASCORBIC ACID) 500 MG tablet Take 500 mg by mouth daily.    . vitamin E 1000 UNIT capsule Take 1,000 Units by mouth daily.     No current facility-administered medications on file prior to visit.      Review of Systems See HPI    Objective:   Physical Exam  Physical Exam  Nursing note and vitals reviewed.  Constitutional: She is oriented to person, place, and time. She appears well-developed and well-nourished.  HENT:  Head: Normocephalic and atraumatic.  Cardiovascular: Normal rate and regular rhythm. Exam reveals no gallop and no friction rub.  No murmur heard.  Pulmonary/Chest: Breath sounds normal. She has no wheezes.  She has no rales.  Neurological: She is alert and oriented to person, place, and time.  Skin: Skin is warm and dry.  Psychiatric: She has a normal mood and affect. Her behavior is normal.         Assessment & Plan:  Hyperlipidemia  Will check lipid/liver today

## 2014-09-18 ENCOUNTER — Telehealth: Payer: Self-pay | Admitting: *Deleted

## 2014-09-18 NOTE — Telephone Encounter (Signed)
I spoke with Hassan Rowan and gave her the lab results- eh

## 2014-09-18 NOTE — Telephone Encounter (Signed)
-----   Message from Lanice Shirts, MD sent at 09/18/2014  4:05 PM EST ----- Call pt and let her know her cholesterol and liver test looks great.   Continue med 3 times a week

## 2014-09-22 ENCOUNTER — Other Ambulatory Visit: Payer: Self-pay

## 2014-09-22 ENCOUNTER — Telehealth: Payer: Self-pay

## 2014-09-22 NOTE — Telephone Encounter (Signed)
Spoke with patient and informed her that Dr. Carlean Purl said based upon current recommendations and based upon the polyp pathology report she is not due for colon until October 2017. Recall put into system and she will be notified then.  Records to be scanned into system.

## 2014-09-22 NOTE — Telephone Encounter (Signed)
Refill request

## 2014-09-22 NOTE — Telephone Encounter (Signed)
Kirsten Sullivan called to say she forgot to tell us when she was in last week that she needs refills on her simvastatin (ZOCOR) 5 MG tablet

## 2014-09-23 MED ORDER — SIMVASTATIN 5 MG PO TABS: ORAL_TABLET | ORAL | Status: DC

## 2014-10-28 ENCOUNTER — Ambulatory Visit
Admission: RE | Admit: 2014-10-28 | Discharge: 2014-10-28 | Disposition: A | Discharge status: Home / Self Care | Payer: BLUE CROSS/BLUE SHIELD | Source: Ambulatory Visit | Attending: Internal Medicine | Admitting: Internal Medicine

## 2014-10-28 DIAGNOSIS — E2839 Other primary ovarian failure: Secondary | ICD-10-CM

## 2014-10-29 ENCOUNTER — Telehealth: Payer: Self-pay | Admitting: *Deleted

## 2014-10-29 NOTE — Telephone Encounter (Signed)
-----   Message from Lanice Shirts, MD sent at 10/29/2014 11:16 AM EST ----- Call pt and let her know that her bone density is normal

## 2014-10-29 NOTE — Telephone Encounter (Signed)
Kirsten Sullivan is aware that her DEXA is normal

## 2015-01-05 ENCOUNTER — Telehealth: Payer: Self-pay | Admitting: *Deleted

## 2015-01-05 NOTE — Telephone Encounter (Signed)
I spoke with Kirsten Sullivan in regards to her chest tightness and advised her to go to the ED or urgent care. She voiced understanding. She has a follow up appointment with [place] to follow up on this

## 2015-01-06 ENCOUNTER — Encounter: Payer: Self-pay | Admitting: Emergency Medicine

## 2015-01-06 ENCOUNTER — Emergency Department (INDEPENDENT_AMBULATORY_CARE_PROVIDER_SITE_OTHER)
Admission: EM | Admit: 2015-01-06 | Discharge: 2015-01-06 | Disposition: A | Discharge status: Home / Self Care | Payer: Medicare Other | Source: Home / Self Care | Attending: Emergency Medicine | Admitting: Emergency Medicine

## 2015-01-06 DIAGNOSIS — R079 Chest pain, unspecified: Secondary | ICD-10-CM | POA: Diagnosis not present

## 2015-01-06 NOTE — ED Provider Notes (Signed)
CSN: [id number]     Arrival date & time 01/06/15  4315 History   First MD Initiated Contact with Patient 01/06/15 [phone]     Chief Complaint  Patient presents with  . Chest Pain   Chest tightness one week ago, PCP advised EKG, not in pain now Patient is a 74 y.o. female presenting with chest pain. The history is provided by the patient.  Chest Pain Chest pain location: Diffusely over left and right chest. Not in pain now. Last episode was one week ago, occurred at rest, lasted about 1 minute, then resolved on its own. Pain radiates to:  Does not radiate Pain severity:  Moderate Onset quality:  Unable to specify Timing:  Unable to specify Progression:  Resolved Context: not raising an arm and no trauma   Ineffective treatments:  None tried Associated symptoms: no abdominal pain, no altered mental status, no anxiety, no back pain, no claudication, no cough, no diaphoresis, no dysphagia, no fatigue, no fever, no headache, no heartburn, no lower extremity edema, no nausea, no near-syncope, no numbness, no orthopnea, no palpitations, no shortness of breath, not vomiting and no weakness    Patient has been otherwise healthy. He feels well right now. She exercises regularly by vigorous walking. Denies any chest pain with exertion. Past Medical History  Diagnosis Date  . Fibrocystic breast   . Anemia   . Hyperlipidemia   . Adenomatous polyps     colonoscopy 07/07/2011 Dr. Marcelle [place]   Past Surgical History  Procedure Laterality Date  . Abdominal hysterectomy    . Breast biopsy      right- benign   Family History  Problem Relation Age of Onset  . Alzheimer's disease Mother   . Prostate cancer Father   . Macular degeneration Father   . Gout Maternal Grandmother   . Heart disease Maternal Grandfather 36   History  Substance Use Topics  . Smoking status: Never Smoker   . Smokeless tobacco: Never Used  . Alcohol Use: No   OB History    Gravida Para Term Preterm AB TAB  SAB Ectopic Multiple Living   0 0 0 0 0 0 0 0 0 0      Review of Systems  Constitutional: Negative for fever, diaphoresis and fatigue.  HENT: Negative for trouble swallowing.   Respiratory: Negative for cough and shortness of breath.   Cardiovascular: Positive for chest pain. Negative for palpitations, orthopnea, claudication and near-syncope.  Gastrointestinal: Negative for heartburn, nausea, vomiting and abdominal pain.  Musculoskeletal: Negative for back pain.  Neurological: Negative for weakness, numbness and headaches.  All other systems reviewed and are negative.   Allergies  Review of patient's allergies indicates not on file.  Home Medications   Prior to Admission medications   Medication Sig Start Date End Date Taking? Authorizing Provider  aspirin 81 MG tablet Take 81 mg by mouth daily.    Historical Provider, MD  calcium carbonate (OS-CAL - DOSED IN MG OF ELEMENTAL CALCIUM) 1250 MG tablet Take 1 tablet by mouth.    Historical Provider, MD  Cyanocobalamin (VITAMIN B-12 CR PO) Take 2,500 mcg by mouth daily.    Historical Provider, MD  Multiple Vitamins-Minerals (CENTRUM SILVER PO) Take 1 tablet by mouth daily.    Historical Provider, MD  Omega-3 Fatty Acids (FISH OIL) 1200 MG CAPS Take 1 capsule by mouth daily.    Historical Provider, MD  simvastatin (ZOCOR) 5 MG tablet Take one tablet Mon,Weds, Friday 09/23/14   Altamese Cabal  Schoenhoff, MD  vitamin C (ASCORBIC ACID) 500 MG tablet Take 500 mg by mouth daily.    Historical Provider, MD  vitamin E 1000 UNIT capsule Take 1,000 Units by mouth daily.    Historical Provider, MD   BP 127/70 mmHg  Pulse 77  Temp(Src) 98.3 F (36.8 C) (Oral)  Ht 5\' 3"  (1.6 m)  Wt 144 lb (65.318 kg)  BMI 25.51 kg/m2  SpO2 99% Physical Exam  Constitutional: She is oriented to person, place, and time. She appears well-developed and well-nourished. No distress.  HENT:  Head: Normocephalic and atraumatic.  Eyes: Conjunctivae and EOM are normal.  Pupils are equal, round, and reactive to light. No scleral icterus.  Neck: Normal range of motion.  Cardiovascular: Normal rate, regular rhythm, normal heart sounds and intact distal pulses.  Exam reveals no gallop and no friction rub.   No murmur heard. Pulmonary/Chest: Effort normal and breath sounds normal. No respiratory distress. She has no wheezes. She has no rales. She exhibits no tenderness.  Abdominal: She exhibits no distension.  Musculoskeletal: Normal range of motion. She exhibits no edema.  Neurological: She is alert and oriented to person, place, and time.  Skin: Skin is warm. No rash noted.  Psychiatric: She has a normal mood and affect.  Nursing note and vitals reviewed.   ED Course  ED EKG  Date/Time: 01/06/2015 11:05 AM Performed by: Burnett Harry, DAVID Authorized by: Burnett Harry, DAVID Rhythm: sinus rhythm Ectopy comments: none Rate: normal QRS axis: normal Conduction: conduction normal ST Segments: ST segments normal T Waves: T waves normal Other: no other findings Clinical impression: normal ECG   (including critical care time) Labs Review Labs Reviewed - No data to display  Imaging Review No results found.   MDM   1. Chest pain, unspecified chest pain type     EKG normal. Cardiac exam normal. Normal blood pressure and normal vital signs. Normal pulse ox. Explained to patient the above and based on history and physical exam and normal EKG, I do not suspect any cardiorespiratory cause for the fleeting chest pain episode one week ago. Keep follow-up appointment with PCP tomorrow. Precautions discussed. Red flags discussed. Questions invited and answered. Patient voiced understanding and agreement.   Jacqulyn Cane, MD 01/06/15 1106

## 2015-01-06 NOTE — ED Notes (Signed)
Chest tightness one week ago, PCP ordered EKG, not in pain now

## 2015-01-07 ENCOUNTER — Encounter: Payer: Self-pay | Admitting: Internal Medicine

## 2015-01-07 ENCOUNTER — Ambulatory Visit (INDEPENDENT_AMBULATORY_CARE_PROVIDER_SITE_OTHER): Payer: Medicare Other | Admitting: Internal Medicine

## 2015-01-07 VITALS — BP 110/61 | HR 81 | Resp 16 | Ht 63.5 in | Wt 143.0 lb

## 2015-01-07 DIAGNOSIS — R0789 Other chest pain: Secondary | ICD-10-CM | POA: Diagnosis not present

## 2015-01-07 NOTE — Progress Notes (Signed)
Subjective:    Patient ID: Kirsten Sullivan, female    DOB: Nov 16, 1940, 74 y.o.   MRN: [id number]  HPI  4/05 UC note MDM   1. Chest pain, unspecified chest pain type     EKG normal. Cardiac exam normal. Normal blood pressure and normal vital signs. Normal pulse ox. Explained to patient the above and based on history and physical exam and normal EKG, I do not suspect any cardiorespiratory cause for the fleeting chest pain episode one week ago. Keep follow-up appointment with PCP tomorrow. Precautions discussed. Red flags discussed. Questions invited and answered. Patient voiced understanding and agreement.   Jacqulyn Cane, MD 01/06/15 1106       Kirsten Sullivan is here for follow up of UC visit.  See note above  7-10 days ago described fleeting midsternal chest sharp pain lasting 1-2 mins.  NO SOB ,  No radiation,  No N/V no diaphoresis.  Does not reproduce with exertion  Denies heartburn or dyspeptic symptoms  EKG yesterday normal     She had a friend who had recent MI and this had her concerned    She really does not want further eval   Not on File Past Medical History  Diagnosis Date  . Fibrocystic breast   . Anemia   . Hyperlipidemia   . Adenomatous polyps     colonoscopy 07/07/2011 Dr. Marcelle Sm[place]   Past Surgical History  Procedure Laterality Date  . Abdominal hysterectomy    . Breast biopsy      right- benign   History   Social History  . Marital Status: Single    Spouse Name: N/A  . Number of Children: N/A  . Years of Education: N/A   Occupational History  . Not on file.   Social History Main Topics  . Smoking status: Never Smoker   . Smokeless tobacco: Never Used  . Alcohol Use: No  . Drug Use: No  . Sexual Activity: Not Currently   Other Topics Concern  . Not on file   Social History Narrative   Family History  Problem Relation Age of Onset  . Alzheimer's disease Mother   . Prostate cancer Father   . Macular  degeneration Father   . Gout Maternal Grandmother   . Heart disease Maternal Grandfather 65   Patient Active Problem List   Diagnosis Date Noted  . H/O bone density study 05/16/2013  . H/O hysterectomy for benign disease 03/11/2012  . Anemia   . Fibrocystic breast   . Hyperlipidemia   . Adenomatous polyps    Current Outpatient Prescriptions on File Prior to Visit  Medication Sig Dispense Refill  . aspirin 81 MG tablet Take 81 mg by mouth daily.    . calcium carbonate (OS-CAL - DOSED IN MG OF ELEMENTAL CALCIUM) 1250 MG tablet Take 1 tablet by mouth.    . Cyanocobalamin (VITAMIN B-12 CR PO) Take 2,500 mcg by mouth daily.    . Multiple Vitamins-Minerals (CENTRUM SILVER PO) Take 1 tablet by mouth daily.    . Omega-3 Fatty Acids (FISH OIL) 1200 MG CAPS Take 1 capsule by mouth daily.    . simvastatin (ZOCOR) 5 MG tablet Take one tablet Mon,Weds, Friday 12 tablet 6  . vitamin C (ASCORBIC ACID) 500 MG tablet Take 500 mg by mouth daily.    . vitamin E 1000 UNIT capsule Take 1,000 Units by mouth daily.     No current facility-administered medications on file prior to visit.  Review of Systems See HPI    Objective:   Physical Exam Physical Exam  Nursing note and vitals reviewed.  Constitutional: She is oriented to person, place, and time. She appears well-developed and well-nourished.  HENT:  Head: Normocephalic and atraumatic.  Cardiovascular: Normal rate and regular rhythm. Exam reveals no gallop and no friction rub.  No murmur heard.  Pulmonary/Chest: Breath sounds normal. She has no wheezes. She has no rales.  Neurological: She is alert and oriented to person, place, and time.  Skin: Skin is warm and dry.  Psychiatric: She has a normal mood and affect. Her behavior is normal.          Assessment & Plan:  Aty[ical chest pain  Does not sound of cardiac or pulmonary nature.  Does not sound reflux related.  Sh eis to call if any recurrence

## 2015-02-05 ENCOUNTER — Other Ambulatory Visit: Payer: Self-pay | Admitting: *Deleted

## 2015-02-05 MED ORDER — SIMVASTATIN 5 MG PO TABS: ORAL_TABLET | ORAL | Status: DC

## 2015-08-11 ENCOUNTER — Telehealth: Payer: Self-pay

## 2015-08-11 DIAGNOSIS — E785 Hyperlipidemia, unspecified: Secondary | ICD-10-CM

## 2015-08-11 MED ORDER — SIMVASTATIN 5 MG PO TABS: ORAL_TABLET | ORAL | Status: DC

## 2015-08-11 NOTE — Telephone Encounter (Signed)
The order is in, please schedule the patient a lab visit.       KP

## 2015-08-11 NOTE — Telephone Encounter (Signed)
Lipid done 07/25/14. Pease advise     KP

## 2015-08-11 NOTE — Telephone Encounter (Signed)
Refill x1  But pt needs labs ---lipid cmp

## 2015-08-11 NOTE — Telephone Encounter (Signed)
Caller name: Relationship to patient:self Can be reached:[phone] Pharmacy:  Reason for call: New patient transfer from Dr. Lyndee Leo her new patient appointment is feb 27/2017 she needs refill for simvastatin (ZOCOR) 5 MG tablet.

## 2015-08-14 ENCOUNTER — Other Ambulatory Visit (INDEPENDENT_AMBULATORY_CARE_PROVIDER_SITE_OTHER): Payer: Medicare Other

## 2015-08-14 ENCOUNTER — Telehealth: Payer: Self-pay | Admitting: Family Medicine

## 2015-08-14 DIAGNOSIS — Z1231 Encounter for screening mammogram for malignant neoplasm of breast: Secondary | ICD-10-CM

## 2015-08-14 DIAGNOSIS — E785 Hyperlipidemia, unspecified: Secondary | ICD-10-CM | POA: Diagnosis not present

## 2015-08-14 LAB — COMPREHENSIVE METABOLIC PANEL
ALT: 17 U/L (ref 0–35)
AST: 17 U/L (ref 0–37)
Albumin: 4.4 g/dL (ref 3.5–5.2)
Alkaline Phosphatase: 43 U/L (ref 39–117)
BUN: 16 mg/dL (ref 6–23)
CALCIUM: 9.7 mg/dL (ref 8.4–10.5)
CHLORIDE: 102 meq/L (ref 96–112)
CO2: 32 mEq/L (ref 19–32)
Creatinine, Ser: 0.86 mg/dL (ref 0.40–1.20)
GFR: 68.8 mL/min (ref >60.00)
Glucose, Bld: 92 mg/dL (ref 70–99)
POTASSIUM: 3.9 meq/L (ref 3.5–5.1)
SODIUM: 140 meq/L (ref 135–145)
Total Bilirubin: 0.5 mg/dL (ref 0.2–1.2)
Total Protein: 7.2 g/dL (ref 6.0–8.3)

## 2015-08-14 LAB — LIPID PANEL
CHOLESTEROL: 251 mg/dL — AB (ref 0–200)
HDL: 77.1 mg/dL (ref >39.00)
LDL CALC: 160 mg/dL — AB (ref 0–99)
NonHDL: 173.66
TRIGLYCERIDES: 70 mg/dL (ref 0.0–149.0)
Total CHOL/HDL Ratio: 3
VLDL: 14 mg/dL (ref 0.0–40.0)

## 2015-08-14 NOTE — Telephone Encounter (Signed)
Pt would like to have her mammogram. She says that it  Is time for one. She says that she goes to the breast center.    Please call pt to advise further. CB: [phone]

## 2015-08-14 NOTE — Telephone Encounter (Signed)
She can schedule it, the order is in. [place]

## 2015-08-14 NOTE — Telephone Encounter (Signed)
Called pt to advise. She will call to schedule today.     Thanks.

## 2015-08-17 ENCOUNTER — Other Ambulatory Visit: Payer: Self-pay

## 2015-08-17 DIAGNOSIS — Z1231 Encounter for screening mammogram for malignant neoplasm of breast: Secondary | ICD-10-CM

## 2015-08-18 ENCOUNTER — Other Ambulatory Visit: Payer: Self-pay

## 2015-08-18 MED ORDER — SIMVASTATIN 5 MG PO TABS: ORAL_TABLET | ORAL | Status: DC

## 2015-09-22 ENCOUNTER — Ambulatory Visit
Admission: RE | Admit: 2015-09-22 | Discharge: 2015-09-22 | Disposition: A | Discharge status: Home / Self Care | Payer: Medicare Other | Source: Ambulatory Visit

## 2015-09-22 DIAGNOSIS — Z1231 Encounter for screening mammogram for malignant neoplasm of breast: Secondary | ICD-10-CM

## 2015-10-05 MED FILL — SIMVASTATIN 5 MG TABLET: 5 | 30 days supply | Qty: 12 | Fill #1

## 2015-10-30 MED FILL — SIMVASTATIN 5 MG TABLET: 5 | 30 days supply | Qty: 12 | Fill #2

## 2015-11-03 ENCOUNTER — Other Ambulatory Visit: Payer: Self-pay | Admitting: Family Medicine

## 2015-11-03 DIAGNOSIS — Z1231 Encounter for screening mammogram for malignant neoplasm of breast: Secondary | ICD-10-CM

## 2015-11-27 ENCOUNTER — Encounter: Payer: Self-pay | Admitting: *Deleted

## 2015-11-27 ENCOUNTER — Telehealth: Payer: Self-pay | Admitting: *Deleted

## 2015-11-27 MED FILL — SIMVASTATIN 5 MG TABLET: 5 | 30 days supply | Qty: 12 | Fill #3

## 2015-11-27 NOTE — Telephone Encounter (Signed)
Pre-Visit Call completed with patient and chart updated.   Pre-Visit Info documented in Specialty Comments under [employer].    

## 2015-11-30 ENCOUNTER — Ambulatory Visit (INDEPENDENT_AMBULATORY_CARE_PROVIDER_SITE_OTHER): Payer: Medicare Other | Admitting: Family Medicine

## 2015-11-30 ENCOUNTER — Encounter: Payer: Self-pay | Admitting: Family Medicine

## 2015-11-30 VITALS — BP 110/62 | HR 73 | Temp 98.2°F | Ht 64.0 in | Wt 144.6 lb

## 2015-11-30 DIAGNOSIS — Z7901 Long term (current) use of anticoagulants: Secondary | ICD-10-CM | POA: Diagnosis not present

## 2015-11-30 DIAGNOSIS — E785 Hyperlipidemia, unspecified: Secondary | ICD-10-CM | POA: Diagnosis not present

## 2015-11-30 DIAGNOSIS — Z0000 Encounter for general adult medical examination without abnormal findings: Secondary | ICD-10-CM

## 2015-11-30 MED ORDER — SIMVASTATIN 5 MG PO TABS: ORAL_TABLET | ORAL | Status: DC

## 2015-11-30 NOTE — Patient Instructions (Signed)
Preventive Care for Adults, Female A healthy lifestyle and preventive care can promote health and wellness. Preventive health guidelines for women include the following key practices.  A routine yearly physical is a good way to check with your health care provider about your health and preventive screening. It is a chance to share any concerns and updates on your health and to receive a thorough exam.  Visit your dentist for a routine exam and preventive care every 6 months. Brush your teeth twice a day and floss once a day. Good oral hygiene prevents tooth decay and gum disease.  The frequency of eye exams is based on your age, health, family medical history, use of contact lenses, and other factors. Follow your health care provider's recommendations for frequency of eye exams.  Eat a healthy diet. Foods like vegetables, fruits, whole grains, low-fat dairy products, and lean protein foods contain the nutrients you need without too many calories. Decrease your intake of foods high in solid fats, added sugars, and salt. Eat the right amount of calories for you.Get information about a proper diet from your health care provider, if necessary.  Regular physical exercise is one of the most important things you can do for your health. Most adults should get at least 150 minutes of moderate-intensity exercise (any activity that increases your heart rate and causes you to sweat) each week. In addition, most adults need muscle-strengthening exercises on 2 or more days a week.  Maintain a healthy weight. The body mass index (BMI) is a screening tool to identify possible weight problems. It provides an estimate of body fat based on height and weight. Your health care provider can find your BMI and can help you achieve or maintain a healthy weight.For adults 20 years and older:  A BMI below 18.5 is considered underweight.  A BMI of 18.5 to 24.9 is normal.  A BMI of 25 to 29.9 is considered overweight.  A  BMI of 30 and above is considered obese.  Maintain normal blood lipids and cholesterol levels by exercising and minimizing your intake of saturated fat. Eat a balanced diet with plenty of fruit and vegetables. Blood tests for lipids and cholesterol should begin at age 45 and be repeated every 5 years. If your lipid or cholesterol levels are high, you are over 50, or you are at high risk for heart disease, you may need your cholesterol levels checked more frequently.Ongoing high lipid and cholesterol levels should be treated with medicines if diet and exercise are not working.  If you smoke, find out from your health care provider how to quit. If you do not use tobacco, do not start.  Lung cancer screening is recommended for adults aged 45-80 years who are at high risk for developing lung cancer because of a history of smoking. A yearly low-dose CT scan of the lungs is recommended for people who have at least a 30-pack-year history of smoking and are a current smoker or have quit within the past 15 years. A pack year of smoking is smoking an average of 1 pack of cigarettes a day for 1 year (for example: 1 pack a day for 30 years or 2 packs a day for 15 years). Yearly screening should continue until the smoker has stopped smoking for at least 15 years. Yearly screening should be stopped for people who develop a health problem that would prevent them from having lung cancer treatment.  If you are pregnant, do not drink alcohol. If you are  breastfeeding, be very cautious about drinking alcohol. If you are not pregnant and choose to drink alcohol, do not have more than 1 drink per day. One drink is considered to be 12 ounces (355 mL) of beer, 5 ounces (148 mL) of wine, or 1.5 ounces (44 mL) of liquor.  Avoid use of street drugs. Do not share needles with anyone. Ask for help if you need support or instructions about stopping the use of drugs.  High blood pressure causes heart disease and increases the risk  of stroke. Your blood pressure should be checked at least every 1 to 2 years. Ongoing high blood pressure should be treated with medicines if weight loss and exercise do not work.  If you are 55-79 years old, ask your health care provider if you should take aspirin to prevent strokes.  Diabetes screening is done by taking a blood sample to check your blood glucose level after you have not eaten for a certain period of time (fasting). If you are not overweight and you do not have risk factors for diabetes, you should be screened once every 3 years starting at age 45. If you are overweight or obese and you are 40-70 years of age, you should be screened for diabetes every year as part of your cardiovascular risk assessment.  Breast cancer screening is essential preventive care for women. You should practice "breast self-awareness." This means understanding the normal appearance and feel of your breasts and may include breast self-examination. Any changes detected, no matter how small, should be reported to a health care provider. Women in their 20s and 30s should have a clinical breast exam (CBE) by a health care provider as part of a regular health exam every 1 to 3 years. After age 40, women should have a CBE every year. Starting at age 40, women should consider having a mammogram (breast X-ray test) every year. Women who have a family history of breast cancer should talk to their health care provider about genetic screening. Women at a high risk of breast cancer should talk to their health care providers about having an MRI and a mammogram every year.  Breast cancer gene (BRCA)-related cancer risk assessment is recommended for women who have family members with BRCA-related cancers. BRCA-related cancers include breast, ovarian, tubal, and peritoneal cancers. Having family members with these cancers may be associated with an increased risk for harmful changes (mutations) in the breast cancer genes BRCA1 and  BRCA2. Results of the assessment will determine the need for genetic counseling and BRCA1 and BRCA2 testing.  Your health care provider may recommend that you be screened regularly for cancer of the pelvic organs (ovaries, uterus, and vagina). This screening involves a pelvic examination, including checking for microscopic changes to the surface of your cervix (Pap test). You may be encouraged to have this screening done every 3 years, beginning at age 21.  For women ages 30-65, health care providers may recommend pelvic exams and Pap testing every 3 years, or they may recommend the Pap and pelvic exam, combined with testing for human papilloma virus (HPV), every 5 years. Some types of HPV increase your risk of cervical cancer. Testing for HPV may also be done on women of any age with unclear Pap test results.  Other health care providers may not recommend any screening for nonpregnant women who are considered low risk for pelvic cancer and who do not have symptoms. Ask your health care provider if a screening pelvic exam is right for   you.  If you have had past treatment for cervical cancer or a condition that could lead to cancer, you need Pap tests and screening for cancer for at least 20 years after your treatment. If Pap tests have been discontinued, your risk factors (such as having a new sexual partner) need to be reassessed to determine if screening should resume. Some women have medical problems that increase the chance of getting cervical cancer. In these cases, your health care provider may recommend more frequent screening and Pap tests.  Colorectal cancer can be detected and often prevented. Most routine colorectal cancer screening begins at the age of 50 years and continues through age 75 years. However, your health care provider may recommend screening at an earlier age if you have risk factors for colon cancer. On a yearly basis, your health care provider may provide home test kits to check  for hidden blood in the stool. Use of a small camera at the end of a tube, to directly examine the colon (sigmoidoscopy or colonoscopy), can detect the earliest forms of colorectal cancer. Talk to your health care provider about this at age 50, when routine screening begins. Direct exam of the colon should be repeated every 5-10 years through age 75 years, unless early forms of precancerous polyps or small growths are found.  People who are at an increased risk for hepatitis B should be screened for this virus. You are considered at high risk for hepatitis B if:  You were born in a country where hepatitis B occurs often. Talk with your health care provider about which countries are considered high risk.  Your parents were born in a high-risk country and you have not received a shot to protect against hepatitis B (hepatitis B vaccine).  You have HIV or AIDS.  You use needles to inject street drugs.  You live with, or have sex with, someone who has hepatitis B.  You get hemodialysis treatment.  You take certain medicines for conditions like cancer, organ transplantation, and autoimmune conditions.  Hepatitis C blood testing is recommended for all people born from 1945 through 1965 and any individual with known risks for hepatitis C.  Practice safe sex. Use condoms and avoid high-risk sexual practices to reduce the spread of sexually transmitted infections (STIs). STIs include gonorrhea, chlamydia, syphilis, trichomonas, herpes, HPV, and human immunodeficiency virus (HIV). Herpes, HIV, and HPV are viral illnesses that have no cure. They can result in disability, cancer, and death.  You should be screened for sexually transmitted illnesses (STIs) including gonorrhea and chlamydia if:  You are sexually active and are younger than 24 years.  You are older than 24 years and your health care provider tells you that you are at risk for this type of infection.  Your sexual activity has changed  since you were last screened and you are at an increased risk for chlamydia or gonorrhea. Ask your health care provider if you are at risk.  If you are at risk of being infected with HIV, it is recommended that you take a prescription medicine daily to prevent HIV infection. This is called preexposure prophylaxis (PrEP). You are considered at risk if:  You are sexually active and do not regularly use condoms or know the HIV status of your partner(s).  You take drugs by injection.  You are sexually active with a partner who has HIV.  Talk with your health care provider about whether you are at high risk of being infected with HIV. If   you choose to begin PrEP, you should first be tested for HIV. You should then be tested every 3 months for as long as you are taking PrEP.  Osteoporosis is a disease in which the bones lose minerals and strength with aging. This can result in serious bone fractures or breaks. The risk of osteoporosis can be identified using a bone density scan. Women ages 67 years and over and women at risk for fractures or osteoporosis should discuss screening with their health care providers. Ask your health care provider whether you should take a calcium supplement or vitamin D to reduce the rate of osteoporosis.  Menopause can be associated with physical symptoms and risks. Hormone replacement therapy is available to decrease symptoms and risks. You should talk to your health care provider about whether hormone replacement therapy is right for you.  Use sunscreen. Apply sunscreen liberally and repeatedly throughout the day. You should seek shade when your shadow is shorter than you. Protect yourself by wearing long sleeves, pants, a wide-brimmed hat, and sunglasses year round, whenever you are outdoors.  Once a month, do a whole body skin exam, using a mirror to look at the skin on your back. Tell your health care provider of new moles, moles that have irregular borders, moles that  are larger than a pencil eraser, or moles that have changed in shape or color.  Stay current with required vaccines (immunizations).  Influenza vaccine. All adults should be immunized every year.  Tetanus, diphtheria, and acellular pertussis (Td, Tdap) vaccine. Pregnant women should receive 1 dose of Tdap vaccine during each pregnancy. The dose should be obtained regardless of the length of time since the last dose. Immunization is preferred during the 27th-36th week of gestation. An adult who has not previously received Tdap or who does not know her vaccine status should receive 1 dose of Tdap. This initial dose should be followed by tetanus and diphtheria toxoids (Td) booster doses every 10 years. Adults with an unknown or incomplete history of completing a 3-dose immunization series with Td-containing vaccines should begin or complete a primary immunization series including a Tdap dose. Adults should receive a Td booster every 10 years.  Varicella vaccine. An adult without evidence of immunity to varicella should receive 2 doses or a second dose if she has previously received 1 dose. Pregnant females who do not have evidence of immunity should receive the first dose after pregnancy. This first dose should be obtained before leaving the health care facility. The second dose should be obtained 4-8 weeks after the first dose.  Human papillomavirus (HPV) vaccine. Females aged 13-26 years who have not received the vaccine previously should obtain the 3-dose series. The vaccine is not recommended for use in pregnant females. However, pregnancy testing is not needed before receiving a dose. If a female is found to be pregnant after receiving a dose, no treatment is needed. In that case, the remaining doses should be delayed until after the pregnancy. Immunization is recommended for any person with an immunocompromised condition through the age of 61 years if she did not get any or all doses earlier. During the  3-dose series, the second dose should be obtained 4-8 weeks after the first dose. The third dose should be obtained 24 weeks after the first dose and 16 weeks after the second dose.  Zoster vaccine. One dose is recommended for adults aged 30 years or older unless certain conditions are present.  Measles, mumps, and rubella (MMR) vaccine. Adults born  before 1957 generally are considered immune to measles and mumps. Adults born in 1957 or later should have 1 or more doses of MMR vaccine unless there is a contraindication to the vaccine or there is laboratory evidence of immunity to each of the three diseases. A routine second dose of MMR vaccine should be obtained at least 28 days after the first dose for students attending postsecondary schools, health care workers, or international travelers. People who received inactivated measles vaccine or an unknown type of measles vaccine during 1963-1967 should receive 2 doses of MMR vaccine. People who received inactivated mumps vaccine or an unknown type of mumps vaccine before 1979 and are at high risk for mumps infection should consider immunization with 2 doses of MMR vaccine. For females of childbearing age, rubella immunity should be determined. If there is no evidence of immunity, females who are not pregnant should be vaccinated. If there is no evidence of immunity, females who are pregnant should delay immunization until after pregnancy. Unvaccinated health care workers born before 1957 who lack laboratory evidence of measles, mumps, or rubella immunity or laboratory confirmation of disease should consider measles and mumps immunization with 2 doses of MMR vaccine or rubella immunization with 1 dose of MMR vaccine.  Pneumococcal 13-valent conjugate (PCV13) vaccine. When indicated, a person who is uncertain of his immunization history and has no record of immunization should receive the PCV13 vaccine. All adults 65 years of age and older should receive this  vaccine. An adult aged 19 years or older who has certain medical conditions and has not been previously immunized should receive 1 dose of PCV13 vaccine. This PCV13 should be followed with a dose of pneumococcal polysaccharide (PPSV23) vaccine. Adults who are at high risk for pneumococcal disease should obtain the PPSV23 vaccine at least 8 weeks after the dose of PCV13 vaccine. Adults older than 73 years of age who have normal immune system function should obtain the PPSV23 vaccine dose at least 1 year after the dose of PCV13 vaccine.  Pneumococcal polysaccharide (PPSV23) vaccine. When PCV13 is also indicated, PCV13 should be obtained first. All adults aged 65 years and older should be immunized. An adult younger than age 65 years who has certain medical conditions should be immunized. Any person who resides in a nursing home or long-term care facility should be immunized. An adult smoker should be immunized. People with an immunocompromised condition and certain other conditions should receive both PCV13 and PPSV23 vaccines. People with human immunodeficiency virus (HIV) infection should be immunized as soon as possible after diagnosis. Immunization during chemotherapy or radiation therapy should be avoided. Routine use of PPSV23 vaccine is not recommended for American Indians, [place] Natives, or people younger than 65 years unless there are medical conditions that require PPSV23 vaccine. When indicated, people who have unknown immunization and have no record of immunization should receive PPSV23 vaccine. One-time revaccination 5 years after the first dose of PPSV23 is recommended for people aged 19-64 years who have chronic kidney failure, nephrotic syndrome, asplenia, or immunocompromised conditions. People who received 1-2 doses of PPSV23 before age 65 years should receive another dose of PPSV23 vaccine at age 65 years or later if at least 5 years have passed since the previous dose. Doses of PPSV23 are not  needed for people immunized with PPSV23 at or after age 65 years.  Meningococcal vaccine. Adults with asplenia or persistent complement component deficiencies should receive 2 doses of quadrivalent meningococcal conjugate (MenACWY-D) vaccine. The doses should be obtained   at least 2 months apart. Microbiologists working with certain meningococcal bacteria, [place] recruits, people at risk during an outbreak, and people who travel to or live in countries with a high rate of meningitis should be immunized. A first-year college student up through age 34 years who is living in a residence hall should receive a dose if she did not receive a dose on or after her 16th birthday. Adults who have certain high-risk conditions should receive one or more doses of vaccine.  Hepatitis A vaccine. Adults who wish to be protected from this disease, have certain high-risk conditions, work with hepatitis A-infected animals, work in hepatitis A research labs, or travel to or work in countries with a high rate of hepatitis A should be immunized. Adults who were previously unvaccinated and who anticipate close contact with an international adoptee during the first 60 days after arrival in the [place] from a country with a high rate of hepatitis A should be immunized.  Hepatitis B vaccine. Adults who wish to be protected from this disease, have certain high-risk conditions, may be exposed to blood or other infectious body fluids, are household contacts or sex partners of hepatitis B positive people, are clients or workers in certain care facilities, or travel to or work in countries with a high rate of hepatitis B should be immunized.  Haemophilus influenzae type b (Hib) vaccine. A previously unvaccinated person with asplenia or sickle cell disease or having a scheduled splenectomy should receive 1 dose of Hib vaccine. Regardless of previous immunization, a recipient of a hematopoietic stem cell transplant should receive a  3-dose series 6-12 months after her successful transplant. Hib vaccine is not recommended for adults with HIV infection. Preventive Services / Frequency Ages 35 to 4 years  Blood pressure check.** / Every 3-5 years.  Lipid and cholesterol check.** / Every 5 years beginning at age 60.  Clinical breast exam.** / Every 3 years for women in their 71s and 10s.  BRCA-related cancer risk assessment.** / For women who have family members with a BRCA-related cancer (breast, ovarian, tubal, or peritoneal cancers).  Pap test.** / Every 2 years from ages 76 through 26. Every 3 years starting at age 61 through age 76 or 93 with a history of 3 consecutive normal Pap tests.  HPV screening.** / Every 3 years from ages 37 through ages 60 to 51 with a history of 3 consecutive normal Pap tests.  Hepatitis C blood test.** / For any individual with known risks for hepatitis C.  Skin self-exam. / Monthly.  Influenza vaccine. / Every year.  Tetanus, diphtheria, and acellular pertussis (Tdap, Td) vaccine.** / Consult your health care provider. Pregnant women should receive 1 dose of Tdap vaccine during each pregnancy. 1 dose of Td every 10 years.  Varicella vaccine.** / Consult your health care provider. Pregnant females who do not have evidence of immunity should receive the first dose after pregnancy.  HPV vaccine. / 3 doses over 6 months, if 93 and younger. The vaccine is not recommended for use in pregnant females. However, pregnancy testing is not needed before receiving a dose.  Measles, mumps, rubella (MMR) vaccine.** / You need at least 1 dose of MMR if you were born in 1957 or later. You may also need a 2nd dose. For females of childbearing age, rubella immunity should be determined. If there is no evidence of immunity, females who are not pregnant should be vaccinated. If there is no evidence of immunity, females who are  pregnant should delay immunization until after pregnancy.  Pneumococcal  13-valent conjugate (PCV13) vaccine.** / Consult your health care provider.  Pneumococcal polysaccharide (PPSV23) vaccine.** / 1 to 2 doses if you smoke cigarettes or if you have certain conditions.  Meningococcal vaccine.** / 1 dose if you are age 68 to 8 years and a Market researcher living in a residence hall, or have one of several medical conditions, you need to get vaccinated against meningococcal disease. You may also need additional booster doses.  Hepatitis A vaccine.** / Consult your health care provider.  Hepatitis B vaccine.** / Consult your health care provider.  Haemophilus influenzae type b (Hib) vaccine.** / Consult your health care provider. Ages 7 to 53 years  Blood pressure check.** / Every year.  Lipid and cholesterol check.** / Every 5 years beginning at age 25 years.  Lung cancer screening. / Every year if you are aged 11-80 years and have a 30-pack-year history of smoking and currently smoke or have quit within the past 15 years. Yearly screening is stopped once you have quit smoking for at least 15 years or develop a health problem that would prevent you from having lung cancer treatment.  Clinical breast exam.** / Every year after age 48 years.  BRCA-related cancer risk assessment.** / For women who have family members with a BRCA-related cancer (breast, ovarian, tubal, or peritoneal cancers).  Mammogram.** / Every year beginning at age 41 years and continuing for as long as you are in good health. Consult with your health care provider.  Pap test.** / Every 3 years starting at age 65 years through age 37 or 70 years with a history of 3 consecutive normal Pap tests.  HPV screening.** / Every 3 years from ages 72 years through ages 60 to 40 years with a history of 3 consecutive normal Pap tests.  Fecal occult blood test (FOBT) of stool. / Every year beginning at age 21 years and continuing until age 5 years. You may not need to do this test if you get  a colonoscopy every 10 years.  Flexible sigmoidoscopy or colonoscopy.** / Every 5 years for a flexible sigmoidoscopy or every 10 years for a colonoscopy beginning at age 35 years and continuing until age 48 years.  Hepatitis C blood test.** / For all people born from [birth year] through 1965 and any individual with known risks for hepatitis C.  Skin self-exam. / Monthly.  Influenza vaccine. / Every year.  Tetanus, diphtheria, and acellular pertussis (Tdap/Td) vaccine.** / Consult your health care provider. Pregnant women should receive 1 dose of Tdap vaccine during each pregnancy. 1 dose of Td every 10 years.  Varicella vaccine.** / Consult your health care provider. Pregnant females who do not have evidence of immunity should receive the first dose after pregnancy.  Zoster vaccine.** / 1 dose for adults aged 30 years or older.  Measles, mumps, rubella (MMR) vaccine.** / You need at least 1 dose of MMR if you were born in 1957 or later. You may also need a second dose. For females of childbearing age, rubella immunity should be determined. If there is no evidence of immunity, females who are not pregnant should be vaccinated. If there is no evidence of immunity, females who are pregnant should delay immunization until after pregnancy.  Pneumococcal 13-valent conjugate (PCV13) vaccine.** / Consult your health care provider.  Pneumococcal polysaccharide (PPSV23) vaccine.** / 1 to 2 doses if you smoke cigarettes or if you have certain conditions.  Meningococcal vaccine.** /  Consult your health care provider.  Hepatitis A vaccine.** / Consult your health care provider.  Hepatitis B vaccine.** / Consult your health care provider.  Haemophilus influenzae type b (Hib) vaccine.** / Consult your health care provider. Ages 64 years and over  Blood pressure check.** / Every year.  Lipid and cholesterol check.** / Every 5 years beginning at age 23 years.  Lung cancer screening. / Every year if you  are aged 16-80 years and have a 30-pack-year history of smoking and currently smoke or have quit within the past 15 years. Yearly screening is stopped once you have quit smoking for at least 15 years or develop a health problem that would prevent you from having lung cancer treatment.  Clinical breast exam.** / Every year after age 74 years.  BRCA-related cancer risk assessment.** / For women who have family members with a BRCA-related cancer (breast, ovarian, tubal, or peritoneal cancers).  Mammogram.** / Every year beginning at age 44 years and continuing for as long as you are in good health. Consult with your health care provider.  Pap test.** / Every 3 years starting at age 58 years through age 22 or 39 years with 3 consecutive normal Pap tests. Testing can be stopped between 65 and 70 years with 3 consecutive normal Pap tests and no abnormal Pap or HPV tests in the past 10 years.  HPV screening.** / Every 3 years from ages 64 years through ages 70 or 61 years with a history of 3 consecutive normal Pap tests. Testing can be stopped between 65 and 70 years with 3 consecutive normal Pap tests and no abnormal Pap or HPV tests in the past 10 years.  Fecal occult blood test (FOBT) of stool. / Every year beginning at age 40 years and continuing until age 27 years. You may not need to do this test if you get a colonoscopy every 10 years.  Flexible sigmoidoscopy or colonoscopy.** / Every 5 years for a flexible sigmoidoscopy or every 10 years for a colonoscopy beginning at age 7 years and continuing until age 32 years.  Hepatitis C blood test.** / For all people born from [birth year] through 1963 and any individual with known risks for hepatitis C.  Osteoporosis screening.** / A one-time screening for women ages 30 years and over and women at risk for fractures or osteoporosis.  Skin self-exam. / Monthly.  Influenza vaccine. / Every year.  Tetanus, diphtheria, and acellular pertussis (Tdap/Td)  vaccine.** / 1 dose of Td every 10 years.  Varicella vaccine.** / Consult your health care provider.  Zoster vaccine.** / 1 dose for adults aged 35 years or older.  Pneumococcal 13-valent conjugate (PCV13) vaccine.** / Consult your health care provider.  Pneumococcal polysaccharide (PPSV23) vaccine.** / 1 dose for all adults aged 46 years and older.  Meningococcal vaccine.** / Consult your health care provider.  Hepatitis A vaccine.** / Consult your health care provider.  Hepatitis B vaccine.** / Consult your health care provider.  Haemophilus influenzae type b (Hib) vaccine.** / Consult your health care provider. ** Family history and personal history of risk and conditions may change your health care provider's recommendations.   This information is not intended to replace advice given to you by your health care provider. Make sure you discuss any questions you have with your health care provider.   Document Released: 11/15/2001 Document Revised: 10/10/2014 Document Reviewed: 02/14/2011 Elsevier Interactive Patient Education [employer].

## 2015-11-30 NOTE — Progress Notes (Signed)
Pre visit review using our clinic review tool, if applicable. No additional management support is needed unless otherwise documented below in the visit note. 

## 2015-11-30 NOTE — Progress Notes (Signed)
Subjective:   Kirsten Sullivan is a 75 y.o. female who presents for Medicare Annual (Subsequent) preventive examination.  Review of Systems:   Review of Systems  Constitutional: Negative for activity change, appetite change and fatigue.  HENT: Negative for hearing loss, congestion, tinnitus and ear discharge.   Eyes: Negative for visual disturbance (see optho q4m-- vision corrected to 20/20 with glasses).  Respiratory: Negative for cough, chest tightness and shortness of breath.   Cardiovascular: Negative for chest pain, palpitations and leg swelling.  Gastrointestinal: Negative for abdominal pain, diarrhea, constipation and abdominal distention.  Genitourinary: Negative for urgency, frequency, decreased urine volume and difficulty urinating.  Musculoskeletal: Negative for back pain, arthralgias and gait problem.  Skin: Negative for color change, pallor and rash.  Neurological: Negative for dizziness, light-headedness, numbness and headaches.  Hematological: Negative for adenopathy. Does not bruise/bleed easily.  Psychiatric/Behavioral: Negative for suicidal ideas, confusion, sleep disturbance, self-injury, dysphoric mood, decreased concentration and agitation.  Pt is able to read and write and can do all ADLs No risk for falling No abuse/ violence in home          Objective:     Vitals: BP 110/62 mmHg  Pulse 73  Temp(Src) 98.2 F (36.8 C) (Oral)  Ht '5\' 4"'  (1.626 m)  Wt 144 lb 9.6 oz (65.59 kg)  BMI 24.81 kg/m2  SpO2 98% BP 110/62 mmHg  Pulse 73  Temp(Src) 98.2 F (36.8 C) (Oral)  Ht '5\' 4"'  (1.626 m)  Wt 144 lb 9.6 oz (65.59 kg)  BMI 24.81 kg/m2  SpO2 98% General appearance: alert, cooperative, appears stated age and no distress Head: Normocephalic, without obvious abnormality, atraumatic Eyes: conjunctivae/corneas clear. PERRL, EOM's intact. Fundi benign. Ears: normal TM's and external ear canals both ears Nose: Nares normal. Septum midline. Mucosa normal. No  drainage or sinus tenderness. Throat: lips, mucosa, and tongue normal; teeth and gums normal Neck: no adenopathy, no carotid bruit, no JVD, supple, symmetrical, trachea midline and thyroid not enlarged, symmetric, no tenderness/mass/nodules Back: symmetric, no curvature. ROM normal. No CVA tenderness. Lungs: clear to auscultation bilaterally Breasts: normal appearance, no masses or tenderness Heart: regular rate and rhythm, S1, S2 normal, no murmur, click, rub or gallop Abdomen: soft, non-tender; bowel sounds normal; no masses,  no organomegaly Pelvic: not indicated; status post hysterectomy, negative ROS Extremities: extremities normal, atraumatic, no cyanosis or edema Pulses: 2+ and symmetric Skin: Skin color, texture, turgor normal. No rashes or lesions Lymph nodes: Cervical, supraclavicular, and axillary nodes normal. Neurologic: Alert and oriented X 3, normal strength and tone. Normal symmetric reflexes. Normal coordination and gait Psych-- no depression , no anxiety Tobacco History  Smoking status  . Never Smoker   Smokeless tobacco  . Never Used     Counseling given: Not Answered   Past Medical History  Diagnosis Date  . Fibrocystic breast   . Anemia   . Hyperlipidemia   . Adenomatous polyps     colonoscopy 07/07/2011 Dr. KMarcelle Smiling fla   Past Surgical History  Procedure Laterality Date  . Abdominal hysterectomy    . Breast biopsy      right- benign   Family History  Problem Relation Age of Onset  . Alzheimer's disease Mother   . Prostate cancer Father   . Macular degeneration Father   . Gout Maternal Grandmother   . Heart disease Maternal Grandfather [phone] . Hypertension Father    History  Sexual Activity  . Sexual Activity: Not Currently    Outpatient  Encounter Prescriptions as of 11/30/2015  Medication Sig  . aspirin 81 MG tablet Take 81 mg by mouth every other day.   . calcium carbonate (OS-CAL - DOSED IN MG OF ELEMENTAL CALCIUM) 1250 MG  tablet Take 1 tablet by mouth.  . Cyanocobalamin (VITAMIN B-12 CR PO) Take 5,000 mcg by mouth daily.   . Multiple Vitamins-Minerals (CENTRUM SILVER PO) Take 1 tablet by mouth daily.  . Omega-3 Fatty Acids (FISH OIL TRIPLE STRENGTH) 1400 MG CAPS Take 1,400 mg by mouth daily.  . simvastatin (ZOCOR) 5 MG tablet Take one tablet Mon,Weds, Friday  . vitamin C (ASCORBIC ACID) 500 MG tablet Take 500 mg by mouth daily.  Marland Kitchen VITAMIN E PO Take 800 mg by mouth daily.  . [DISCONTINUED] Omega-3 Fatty Acids (FISH OIL) 1200 MG CAPS Take 1 capsule by mouth daily.  . [DISCONTINUED] simvastatin (ZOCOR) 5 MG tablet Take one tablet Mon,Weds, Friday  . [DISCONTINUED] vitamin E 1000 UNIT capsule Take 1,000 Units by mouth daily.   No facility-administered encounter medications on file as of 11/30/2015.    Activities of Daily Living In your present state of health, do you have any difficulty performing the following activities: 11/30/2015  Hearing? N  Vision? N  Difficulty concentrating or making decisions? N  Walking or climbing stairs? N  Dressing or bathing? N  Doing errands, shopping? N    Patient Care Team: Rosalita Chessman, DO as PCP - General (Family Medicine) Monna Fam, MD as Consulting Physician (Ophthalmology)    Assessment:    cpe Exercise Activities and Dietary recommendations--1x a week     Goals    None     Fall Risk Fall Risk  11/30/2015 08/06/2014  Falls in the past year? No No   Depression Screen PHQ 2/9 Scores 11/30/2015 08/06/2014 02/21/2012  PHQ - 2 Score 0 0 0     Cognitive Testing mmse 30/30  Immunization History  Administered Date(s) Administered  . Influenza Split 07/11/2012, 08/04/2015  . Influenza,inj,Quad PF,36+ Mos 07/05/2013, 07/25/2014  . Pneumococcal Conjugate-13 08/18/2014  . Pneumococcal Polysaccharide-23 05/16/2013  . Tdap 02/21/2012  . Zoster 10/04/2007   Screening Tests Health Maintenance  Topic Date Due  . INFLUENZA VACCINE  05/03/2016  .  COLONOSCOPY  08/02/2016  . MAMMOGRAM  09/21/2016  . TETANUS/TDAP  02/20/2022  . DEXA SCAN  Completed  . ZOSTAVAX  Addressed  . PNA vac Low Risk Adult  Completed      Plan:    See AVS During the course of the visit the patient was educated and counseled about the following appropriate screening and preventive services:   Vaccines to include Pneumoccal, Influenza, Hepatitis B, Td, Zostavax, HCV  Electrocardiogram  Cardiovascular Disease  Colorectal cancer screening  Bone density screening  Diabetes screening  Glaucoma screening  Mammography/PAP  Nutrition counseling   Patient Instructions (the written plan) was given to the patient.  1. Hyperlipidemia Not new - simvastatin (ZOCOR) 5 MG tablet; Take one tablet Mon,Weds, Friday  Dispense: 30 tablet; Refill: 3 - POCT urinalysis dipstick - POCT urinalysis dipstick; Future - Lipid panel; Future - CBC with Differential/Platelet; Future - Comp Met (CMET); Future - Lipid panel; Future - Comp Met (CMET); Future  2. Prevention of blood clots   - Ambulatory referral to Gastroenterology  3. Preventative health care    4. Routine history and physical examination of adult    Garnet Koyanagi, DO  11/30/2015

## 2015-12-02 ENCOUNTER — Other Ambulatory Visit (INDEPENDENT_AMBULATORY_CARE_PROVIDER_SITE_OTHER): Payer: Medicare Other

## 2015-12-02 DIAGNOSIS — E785 Hyperlipidemia, unspecified: Secondary | ICD-10-CM

## 2015-12-02 LAB — CBC WITH DIFFERENTIAL/PLATELET
Basophils Absolute: 0 K/uL (ref 0.0–0.1)
Basophils Relative: 0.8 % (ref 0.0–3.0)
Eosinophils Absolute: 0.1 K/uL (ref 0.0–0.7)
Eosinophils Relative: 2.2 % (ref 0.0–5.0)
HCT: 36.2 % (ref 36.0–46.0)
Hemoglobin: 12 g/dL (ref 12.0–15.0)
Lymphocytes Relative: 47.6 % — ABNORMAL HIGH (ref 12.0–46.0)
Lymphs Abs: 2.3 K/uL (ref 0.7–4.0)
MCHC: 33.3 g/dL (ref 30.0–36.0)
MCV: 90.8 fl (ref 78.0–100.0)
Monocytes Absolute: 0.4 K/uL (ref 0.1–1.0)
Monocytes Relative: 7.2 % (ref 3.0–12.0)
Neutro Abs: 2.1 K/uL (ref 1.4–7.7)
Neutrophils Relative %: 42.2 % — ABNORMAL LOW (ref 43.0–77.0)
Platelets: 307 K/uL (ref 150.0–400.0)
RBC: 3.98 Mil/uL (ref 3.87–5.11)
RDW: 14.1 % (ref 11.5–15.5)
WBC: 4.9 K/uL (ref 4.0–10.5)

## 2015-12-02 LAB — COMPREHENSIVE METABOLIC PANEL
ALK PHOS: 40 U/L (ref 39–117)
ALT: 17 U/L (ref 0–35)
AST: 20 U/L (ref 0–37)
Albumin: 4.4 g/dL (ref 3.5–5.2)
BILIRUBIN TOTAL: 0.5 mg/dL (ref 0.2–1.2)
BUN: 17 mg/dL (ref 6–23)
CALCIUM: 9.3 mg/dL (ref 8.4–10.5)
CO2: 32 mEq/L (ref 19–32)
Chloride: 102 mEq/L (ref 96–112)
Creatinine, Ser: 0.82 mg/dL (ref 0.40–1.20)
GFR: 72.62 mL/min (ref >60.00)
GLUCOSE: 89 mg/dL (ref 70–99)
Potassium: 3.9 mEq/L (ref 3.5–5.1)
Sodium: 138 mEq/L (ref 135–145)
TOTAL PROTEIN: 7 g/dL (ref 6.0–8.3)

## 2015-12-02 LAB — LIPID PANEL
Cholesterol: 231 mg/dL — ABNORMAL HIGH (ref 0–200)
HDL: 75.8 mg/dL
LDL Cholesterol: 144 mg/dL — ABNORMAL HIGH (ref 0–99)
NonHDL: 155.47
Total CHOL/HDL Ratio: 3
Triglycerides: 56 mg/dL (ref 0.0–149.0)
VLDL: 11.2 mg/dL (ref 0.0–40.0)

## 2015-12-09 ENCOUNTER — Other Ambulatory Visit: Payer: Self-pay

## 2015-12-09 DIAGNOSIS — E785 Hyperlipidemia, unspecified: Secondary | ICD-10-CM

## 2015-12-09 MED ORDER — SIMVASTATIN 5 MG PO TABS: ORAL_TABLET | ORAL | Status: DC

## 2015-12-09 MED FILL — SIMVASTATIN 5 MG TABLET: 5 | 30 days supply | Qty: 28 | Fill #0

## 2016-01-18 MED FILL — SIMVASTATIN 5 MG TABLET: 5 | 30 days supply | Qty: 28 | Fill #1

## 2016-03-09 MED FILL — SIMVASTATIN 5 MG TABLET: 5 | 30 days supply | Qty: 28 | Fill #2

## 2016-04-27 MED FILL — SIMVASTATIN 5 MG TABLET: 5 | 12 days supply | Qty: 12 | Fill #3

## 2016-06-10 MED FILL — SIMVASTATIN 5 MG TABLET: 5 | 29 days supply | Qty: 16 | Fill #0

## 2016-07-04 MED FILL — SIMVASTATIN 5 MG TABLET: 5 | 29 days supply | Qty: 16 | Fill #1

## 2016-07-13 ENCOUNTER — Encounter: Payer: Self-pay | Admitting: Internal Medicine

## 2016-07-14 ENCOUNTER — Encounter: Payer: Self-pay | Admitting: Internal Medicine

## 2016-07-29 MED FILL — SIMVASTATIN 5 MG TABLET: 5 | 29 days supply | Qty: 16 | Fill #2

## 2016-08-17 ENCOUNTER — Other Ambulatory Visit: Payer: Self-pay | Admitting: Internal Medicine

## 2016-08-17 DIAGNOSIS — Z1231 Encounter for screening mammogram for malignant neoplasm of breast: Secondary | ICD-10-CM

## 2016-08-29 MED FILL — SIMVASTATIN 5 MG TABLET: 5 | 29 days supply | Qty: 16 | Fill #3

## 2016-09-01 ENCOUNTER — Ambulatory Visit (AMBULATORY_SURGERY_CENTER): Payer: Self-pay | Admitting: *Deleted

## 2016-09-01 VITALS — Ht 63.0 in | Wt 139.0 lb

## 2016-09-01 DIAGNOSIS — Z86010 Personal history of colonic polyps: Secondary | ICD-10-CM

## 2016-09-01 NOTE — Progress Notes (Signed)
No egg or soy allergy known to patient  No issues with past sedation with any surgeries  or procedures, no intubation problems  No diet pills per patient No home 02 use per patient  No blood thinners per patient  Pt denies issues with constipation  No A fib or A flutter   

## 2016-09-05 ENCOUNTER — Encounter: Payer: Self-pay | Admitting: Internal Medicine

## 2016-09-15 ENCOUNTER — Ambulatory Visit (AMBULATORY_SURGERY_CENTER): Payer: Medicare Other | Admitting: Internal Medicine

## 2016-09-15 ENCOUNTER — Encounter: Payer: Self-pay | Admitting: Internal Medicine

## 2016-09-15 VITALS — BP 95/47 | HR 66 | Temp 97.3°F | Resp 16 | Ht 63.0 in | Wt 139.0 lb

## 2016-09-15 DIAGNOSIS — D123 Benign neoplasm of transverse colon: Secondary | ICD-10-CM | POA: Diagnosis not present

## 2016-09-15 DIAGNOSIS — D369 Benign neoplasm, unspecified site: Secondary | ICD-10-CM

## 2016-09-15 DIAGNOSIS — Z86010 Personal history of colonic polyps: Secondary | ICD-10-CM

## 2016-09-15 MED ORDER — SODIUM CHLORIDE 0.9 % IV SOLN: 500.0000 mL | INTRAVENOUS | Status: AC

## 2016-09-15 NOTE — Patient Instructions (Addendum)
I found and removed one tiny polyp.  I will let you know pathology results and when to have another routine colonoscopy by mail.  I appreciate the opportunity to care for you. Gatha Mayer, MD, FACG   YOU HAD AN ENDOSCOPIC PROCEDURE TODAY AT [facility]:   Refer to the procedure report that was given to you for any specific questions about what was found during the examination.  If the procedure report does not answer your questions, please call your gastroenterologist to clarify.  If you requested that your care partner not be given the details of your procedure findings, then the procedure report has been included in a sealed envelope for you to review at your convenience later.  YOU SHOULD EXPECT: Some feelings of bloating in the abdomen. Passage of more gas than usual.  Walking can help get rid of the air that was put into your GI tract during the procedure and reduce the bloating. If you had a lower endoscopy (such as a colonoscopy or flexible sigmoidoscopy) you may notice spotting of blood in your stool or on the toilet paper. If you underwent a bowel prep for your procedure, you may not have a normal bowel movement for a few days.  Please Note:  You might notice some irritation and congestion in your nose or some drainage.  This is from the oxygen used during your procedure.  There is no need for concern and it should clear up in a day or so.  SYMPTOMS TO REPORT IMMEDIATELY:   Following lower endoscopy (colonoscopy or flexible sigmoidoscopy):  Excessive amounts of blood in the stool  Significant tenderness or worsening of abdominal pains  Swelling of the abdomen that is new, acute  Fever of 100F or higher   For urgent or emergent issues, a gastroenterologist can be reached at any hour by calling [phone].   DIET:  We do recommend a small meal at first, but then you may proceed to your regular diet.  Drink plenty of fluids but you should avoid  alcoholic beverages for 24 hours.  ACTIVITY:  You should plan to take it easy for the rest of today and you should NOT DRIVE or use heavy machinery until tomorrow (because of the sedation medicines used during the test).    FOLLOW UP: Our staff will call the number listed on your records the next business day following your procedure to check on you and address any questions or concerns that you may have regarding the information given to you following your procedure. If we do not reach you, we will leave a message.  However, if you are feeling well and you are not experiencing any problems, there is no need to return our call.  We will assume that you have returned to your regular daily activities without incident.  If any biopsies were taken you will be contacted by phone or by letter within the next 1-3 weeks.  Please call us at [phone] if you have not heard about the biopsies in 3 weeks.    SIGNATURES/CONFIDENTIALITY: You and/or your care partner have signed paperwork which will be entered into your electronic medical record.  These signatures attest to the fact that that the information above on your After Visit Summary has been reviewed and is understood.  Full responsibility of the confidentiality of this discharge information lies with you and/or your care-partner.  Read all of the handouts given to you by your recovery room nurse.  Thank-you for choosing [place] for your healthcare needs today.

## 2016-09-15 NOTE — Op Note (Addendum)
[employer] Patient Name: Kirsten Sullivan Procedure Date: 09/15/2016 10:50 AM MRN: [id number] Endoscopist: Gatha Mayer , MD Age: 75 Referring MD:  Date of Birth: [birth year] Gender: Female Account #: [id number] Procedure:                Colonoscopy Indications:              Surveillance: Personal history of adenomatous                            polyps on last colonoscopy 5 years ago Medicines:                Propofol per Anesthesia, Monitored Anesthesia Care Procedure:                Pre-Anesthesia Assessment:                           - Prior to the procedure, a History and Physical                            was performed, and patient medications and                            allergies were reviewed. The patient's tolerance of                            previous anesthesia was also reviewed. The risks                            and benefits of the procedure and the sedation                            options and risks were discussed with the patient.                            All questions were answered, and informed consent                            was obtained. Prior Anticoagulants: The patient                            last took aspirin 1 day prior to the procedure. ASA                            Grade Assessment: II - A patient with mild systemic                            disease. After reviewing the risks and benefits,                            the patient was deemed in satisfactory condition to                            undergo the procedure.  After obtaining informed consent, the colonoscope                            was passed under direct vision. Throughout the                            procedure, the patient's blood pressure, pulse, and                            oxygen saturations were monitored continuously. The                            Model CF-H180AL [id number]) scope was introduced                            through  the anus and advanced to the the cecum,                            identified by appendiceal orifice and ileocecal                            valve. The colonoscopy was performed without                            difficulty. The patient tolerated the procedure                            well. The quality of the bowel preparation was                            adequate. The bowel preparation used was Miralax.                            The ileocecal valve, appendiceal orifice, and                            rectum were photographed. Scope In: 10:54:35 AM Scope Out: 11:18:20 AM Scope Withdrawal Time: 0 hours 19 minutes 2 seconds  Total Procedure Duration: 0 hours 23 minutes 45 seconds  Findings:                 The perianal and digital rectal examinations were                            normal.                           A 2 mm polyp was found in the transverse colon. The                            polyp was sessile. The polyp was removed with a                            cold biopsy forceps. Resection and retrieval were  complete. Verification of patient identification                            for the specimen was done. Estimated blood loss was                            minimal.                           The exam was otherwise without abnormality on                            direct and retroflexion views. Complications:            No immediate complications. Estimated Blood Loss:     Estimated blood loss was minimal. Impression:               - One 2 mm polyp in the transverse colon, removed                            with a cold biopsy forceps. Resected and retrieved.                           - The examination was otherwise normal on direct                            and retroflexion views.                           - Personal history of colonic polyp - 8 mm rectal                            adenoma 2012 Recommendation:           - Patient has a contact  number available for                            emergencies. The signs and symptoms of potential                            delayed complications were discussed with the                            patient. Return to normal activities tomorrow.                            Written discharge instructions were provided to the                            patient.                           - Continue present medications.                           - Resume previous diet.                           -  Repeat colonoscopy is recommended for                            surveillance. The colonoscopy date will be                            determined after pathology results from today's                            exam become available for review. Gatha Mayer, MD 09/15/2016 11:28:08 AM This report has been signed electronically.

## 2016-09-15 NOTE — Progress Notes (Signed)
Called to room to assist during endoscopic procedure.  Patient ID and intended procedure confirmed with present staff. Received instructions for my participation in the procedure from the performing physician.  

## 2016-09-15 NOTE — Progress Notes (Signed)
Patient awakening,vss,report to rn 

## 2016-09-16 ENCOUNTER — Telehealth: Payer: Self-pay | Admitting: *Deleted

## 2016-09-16 NOTE — Telephone Encounter (Signed)
  Follow up Call-  Call back number 09/15/2016  Post procedure Call Back phone  # [phone]  Permission to leave phone message Yes  Some recent data might be hidden     Patient questions:  Do you have a fever, pain , or abdominal swelling? No. Pain Score  0 *  Have you tolerated food without any problems? Yes.    Have you been able to return to your normal activities? Yes.    Do you have any questions about your discharge instructions: Diet   No. Medications  No. Follow up visit  No.  Do you have questions or concerns about your Care? No.  Actions: * If pain score is 4 or above: No action needed, pain <4.

## 2016-09-22 MED FILL — SIMVASTATIN 5 MG TABLET: 5 | 28 days supply | Qty: 16 | Fill #4

## 2016-09-23 ENCOUNTER — Ambulatory Visit
Admission: RE | Admit: 2016-09-23 | Discharge: 2016-09-23 | Disposition: A | Discharge status: Home / Self Care | Payer: Medicare Other | Source: Ambulatory Visit | Attending: Internal Medicine | Admitting: Internal Medicine

## 2016-09-23 DIAGNOSIS — Z1231 Encounter for screening mammogram for malignant neoplasm of breast: Secondary | ICD-10-CM

## 2016-09-28 NOTE — Progress Notes (Signed)
Diminutive adenoma Recall colon 09/2019  No letter - My Chart note sent

## 2016-10-21 MED FILL — SIMVASTATIN 5 MG TABLET: 5 | 28 days supply | Qty: 16 | Fill #5

## 2016-11-18 MED FILL — SIMVASTATIN 5 MG TABLET: 5 | 28 days supply | Qty: 16 | Fill #6

## 2016-12-16 MED FILL — SIMVASTATIN 5 MG TABLET: 5 | 28 days supply | Qty: 16 | Fill #7

## 2017-01-13 MED FILL — SIMVASTATIN 5 MG TABLET: 5 | 28 days supply | Qty: 16 | Fill #8

## 2017-02-10 MED FILL — SIMVASTATIN 5 MG TABLET: 5 | 28 days supply | Qty: 16 | Fill #9

## 2017-03-09 MED FILL — SIMVASTATIN 5 MG TABLET: 5 | 28 days supply | Qty: 16 | Fill #10

## 2017-04-06 MED FILL — SIMVASTATIN 5 MG TABLET: 5 | 90 days supply | Qty: 50 | Fill #0

## 2017-07-03 MED FILL — SIMVASTATIN 5 MG TABLET: 5 | 90 days supply | Qty: 50 | Fill #1

## 2017-08-01 ENCOUNTER — Other Ambulatory Visit: Payer: Self-pay | Admitting: Internal Medicine

## 2017-08-01 DIAGNOSIS — Z1231 Encounter for screening mammogram for malignant neoplasm of breast: Secondary | ICD-10-CM

## 2017-09-28 ENCOUNTER — Ambulatory Visit
Admission: RE | Admit: 2017-09-28 | Discharge: 2017-09-28 | Disposition: A | Discharge status: Home / Self Care | Payer: Medicare Other | Source: Ambulatory Visit | Attending: Internal Medicine | Admitting: Internal Medicine

## 2017-09-28 DIAGNOSIS — Z1231 Encounter for screening mammogram for malignant neoplasm of breast: Secondary | ICD-10-CM

## 2017-09-29 MED FILL — SIMVASTATIN 5 MG TABLET: 5 | 90 days supply | Qty: 50 | Fill #2

## 2017-12-27 MED FILL — SIMVASTATIN 5 MG TABLET: 5 | 90 days supply | Qty: 50 | Fill #0

## 2018-03-22 MED FILL — SIMVASTATIN 5 MG TABLET: 5 | 90 days supply | Qty: 50 | Fill #1

## 2018-06-18 MED FILL — SIMVASTATIN 5 MG TABS: 5 | 90 days supply | Qty: 50 | Fill #2

## 2018-09-03 ENCOUNTER — Other Ambulatory Visit: Payer: Self-pay | Admitting: Internal Medicine

## 2018-09-03 DIAGNOSIS — Z1231 Encounter for screening mammogram for malignant neoplasm of breast: Secondary | ICD-10-CM

## 2018-09-12 MED FILL — SIMVASTATIN 5 MG TABS: 5 | 90 days supply | Qty: 50 | Fill #0

## 2018-10-15 ENCOUNTER — Ambulatory Visit
Admission: RE | Admit: 2018-10-15 | Discharge: 2018-10-15 | Disposition: A | Discharge status: Home / Self Care | Payer: Medicare Other | Source: Ambulatory Visit | Attending: Internal Medicine | Admitting: Internal Medicine

## 2018-10-15 DIAGNOSIS — Z1231 Encounter for screening mammogram for malignant neoplasm of breast: Secondary | ICD-10-CM

## 2018-11-12 ENCOUNTER — Other Ambulatory Visit: Payer: Self-pay | Admitting: Internal Medicine

## 2018-11-12 DIAGNOSIS — E2839 Other primary ovarian failure: Secondary | ICD-10-CM

## 2018-12-11 MED FILL — SIMVASTATIN 5 MG TABS: 5 | 90 days supply | Qty: 50 | Fill #1

## 2019-01-01 ENCOUNTER — Other Ambulatory Visit: Payer: Medicare Other

## 2019-02-13 ENCOUNTER — Other Ambulatory Visit: Payer: Medicare Other

## 2019-03-04 MED FILL — SIMVASTATIN 5 MG TABS: 5 | 90 days supply | Qty: 50 | Fill #2

## 2019-03-21 ENCOUNTER — Ambulatory Visit: Payer: Medicare Other | Admitting: Family Medicine

## 2019-03-21 ENCOUNTER — Telehealth: Payer: Self-pay

## 2019-03-21 NOTE — Telephone Encounter (Signed)
Questions for Screening COVID-19  Symptom onset:n/a  Travel or Contacts: no   During this illness, did/does the patient experience any of the following symptoms? Fever >100.71F []   Yes [x]   No []   Unknown Subjective fever (felt feverish) []   Yes [x]   No []   Unknown Chills []   Yes [x]   No []   Unknown Muscle aches (myalgia) []   Yes [x]   No []   Unknown Runny nose (rhinorrhea) []   Yes [x]   No []   Unknown Sore throat []   Yes [x]   No []   Unknown Cough (new onset or worsening of chronic cough) []   Yes [x]   No []   Unknown Shortness of breath (dyspnea) []   Yes [x]   No []   Unknown Nausea or vomiting []   Yes [x]   No []   Unknown Headache []   Yes [x]   No []   Unknown Abdominal pain  []   Yes [x]   No []   Unknown Diarrhea (?3 loose/looser than normal stools/24hr period) []   Yes [x]   No []   Unknown Other, specify:  Patient risk factors: Smoker? []   Current []   Former []   Never If female, currently pregnant? []   Yes []   No  Patient Active Problem List   Diagnosis Date Noted  . H/O bone density study 05/16/2013  . H/O hysterectomy for benign disease 03/11/2012  . Anemia   . Fibrocystic breast   . Hyperlipidemia   . Adenomatous polyps     Plan:  []   High risk for COVID-19 with red flags go to ED (with CP, SOB, weak/lightheaded, or fever > 101.5). Call ahead.  []   High risk for COVID-19 but stable. Inform provider and coordinate time for [facility] visit.   []   No red flags but URI signs or symptoms okay for [facility] visit.

## 2019-03-22 ENCOUNTER — Ambulatory Visit: Payer: Medicare Other | Admitting: Family Medicine

## 2019-03-22 ENCOUNTER — Encounter: Payer: Self-pay | Admitting: Family Medicine

## 2019-03-22 VITALS — BP 118/82 | HR 79 | Temp 98.1°F | Ht 63.0 in | Wt 142.8 lb

## 2019-03-22 DIAGNOSIS — Z7689 Persons encountering health services in other specified circumstances: Secondary | ICD-10-CM | POA: Diagnosis not present

## 2019-03-22 DIAGNOSIS — E782 Mixed hyperlipidemia: Secondary | ICD-10-CM

## 2019-03-22 MED ORDER — SIMVASTATIN 5 MG PO TABS: 5.0000 mg | ORAL_TABLET | Freq: Every day | ORAL | 3 refills | Status: DC

## 2019-03-22 NOTE — Progress Notes (Signed)
Kirsten Sullivan is a 78 y.o. female  Chief Complaint  Patient presents with  . Establish Care    est care/ Doctor retired    HPI: Kirsten Sullivan is a 78 y.o. female here to establish care with our office. Her previous PCP retired Dr. Coralyn Mark.  Her PMHx is notable for hyperlipidemia for which she takes simvastatin 5mg  daily . She need a refill of this. She states she is "very healthy" for 78yo. She is active, walks almost daily. 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 Her sister, 5, nephew lives in the area.  She was seeing previous PCP q52mo and would like to continue this.  Specialists: none Dental: UTD Vision: UTD  Last CPE, labs: 11/2018  Last mammo: 10/2018 Last Dexa: scheduled 04/08/19 Last colonoscopy: 09/2016 ([facility] Dr. Carlean Purl)  Med refills needed today: simvastatin 5mg    Past Medical History:  Diagnosis Date  . Adenomatous polyps    colonoscopy 07/07/2011 [place]  . Anemia   . Fibrocystic breast   . Hyperlipidemia     Past Surgical History:  Procedure Laterality Date  . ABDOMINAL HYSTERECTOMY    . BREAST BIOPSY     right- benign  . COLONOSCOPY    . POLYPECTOMY      Social History   Socioeconomic History  . Marital status: Single    Spouse name: Not on file  . Number of children: Not on file  . Years of education: Not on file  . Highest education level: Not on file  Occupational History  . Not on file  Social Needs  . Financial resource strain: Not on file  . Food insecurity    Worry: Not on file    Inability: Not on file  . Transportation needs    Medical: Not on file    Non-medical: Not on file  Tobacco Use  . Smoking status: Never Smoker  . Smokeless tobacco: Never Used  Substance and Sexual Activity  . Alcohol use: No  . Drug use: No  . Sexual activity: Not Currently  Lifestyle  . Physical activity    Days per week: Not on file    Minutes per session: Not on file  .  Stress: Not on file  Relationships  . Social Herbalist on phone: Not on file    Gets together: Not on file    Attends religious service: Not on file    Active member of club or organization: Not on file    Attends meetings of clubs or organizations: Not on file    Relationship status: Not on file  . Intimate partner violence    Fear of current or ex partner: Not on file    Emotionally abused: Not on file    Physically abused: Not on file    Forced sexual activity: Not on file  Other Topics Concern  . Not on file  Social History Narrative  . Not on file    Family History  Problem Relation Age of Onset  . Alzheimer's disease Mother   . Prostate cancer Father   . Macular degeneration Father   . Hypertension Father   . Gout Maternal Grandmother   . Heart disease Maternal Grandfather 40  . Colitis Sister   . Colon cancer Neg Hx   . Colon polyps Neg Hx   . Rectal cancer Neg Hx   . Stomach cancer Neg Hx   . Breast cancer Neg  Hx      Immunization History  Administered Date(s) Administered  . Influenza Split 07/11/2012, 08/04/2015  . Influenza,inj,Quad PF,6+ Mos 07/05/2013, 07/25/2014  . Pneumococcal Conjugate-13 08/18/2014  . Pneumococcal Polysaccharide-23 05/16/2013  . Tdap 02/21/2012  . Zoster 10/04/2007    Outpatient Encounter Medications as of 03/22/2019  Medication Sig  . Calcium Carb-Cholecalciferol (CALCIUM+D3) 600-800 MG-UNIT TABS Take by mouth.  . cholecalciferol (VITAMIN D) 1000 units tablet Take 2,000 Units by mouth daily.   . Cyanocobalamin (VITAMIN B-12 CR PO) Take 1,000 mcg by mouth daily.   . Multiple Vitamins-Minerals (CENTRUM SILVER PO) Take 1 tablet by mouth daily.  . Omega-3 Fatty Acids (FISH OIL TRIPLE STRENGTH) 1400 MG CAPS Take 1,200 mg by mouth daily.   . simvastatin (ZOCOR) 5 MG tablet Take 1 tablet (5 mg total) by mouth at bedtime. Take one tablet Mon, Weds, Friday and Saturday  . vitamin C (ASCORBIC ACID) 500 MG tablet Take 500 mg by  mouth daily.  Marland Kitchen VITAMIN E PO Take 800 mg by mouth daily.  . [DISCONTINUED] simvastatin (ZOCOR) 5 MG tablet Take one tablet Mon, Weds, Friday and Saturday  . [DISCONTINUED] aspirin 81 MG tablet Take 81 mg by mouth every other day.   . [DISCONTINUED] calcium carbonate (OS-CAL - DOSED IN MG OF ELEMENTAL CALCIUM) 1250 MG tablet Take 1 tablet by mouth.   Facility-Administered Encounter Medications as of 03/22/2019  Medication  . 0.9 %  sodium chloride infusion     ROS: Gen: no fever, chills  Skin: no rash, itching ENT: no ear pain, ear drainage, nasal congestion, rhinorrhea, sinus pressure, sore throat Eyes: no blurry vision, double vision Resp: no cough, wheeze,SOB CV: no CP, palpitations, LE edema,  GI: no heartburn, n/v/d/c, abd pain GU: no dysuria, urgency, frequency, hematuria  MSK: no joint pain, myalgias, back pain Neuro: no dizziness, headache, weakness, vertigo Psych: no depression, anxiety, insomnia   No Known Allergies  BP 118/82   Pulse 79   Temp 98.1 F (36.7 C) (Oral)   Ht 5\' 3"  (1.6 m)   Wt 142 lb 12.8 oz (64.8 kg)   SpO2 98%   BMI 25.30 kg/m   Physical Exam  Constitutional: She is oriented to person, place, and time. She appears well-developed and well-nourished. No distress.  Cardiovascular: Normal rate and regular rhythm.  Pulmonary/Chest: Effort normal and breath sounds normal.  Musculoskeletal:        General: No edema.  Neurological: She is alert and oriented to person, place, and time.  Psychiatric: She has a normal mood and affect. Her behavior is normal.     A/P:  1. Hyperlipidemia - due for FLP in 05/2019 Refill: - simvastatin (ZOCOR) 5 MG tablet; Take 1 tablet (5 mg total) by mouth at bedtime. Take one tablet Mon, Weds, Friday and Saturday  Dispense: 90 tablet; Refill: 3  2. Encounter to establish care with new doctor - will plan to continue q60mo f/u as pt requests and was doing with previous PCP. She is due for appt in 05/2019 and will  schedule

## 2019-04-08 ENCOUNTER — Other Ambulatory Visit: Payer: Self-pay

## 2019-04-08 ENCOUNTER — Ambulatory Visit
Admission: RE | Admit: 2019-04-08 | Discharge: 2019-04-08 | Disposition: A | Discharge status: Home / Self Care | Payer: Medicare Other | Source: Ambulatory Visit | Attending: Internal Medicine | Admitting: Internal Medicine

## 2019-04-08 DIAGNOSIS — E2839 Other primary ovarian failure: Secondary | ICD-10-CM

## 2019-05-23 ENCOUNTER — Other Ambulatory Visit: Payer: Self-pay

## 2019-05-23 ENCOUNTER — Ambulatory Visit: Payer: Medicare Other | Admitting: Family Medicine

## 2019-05-23 ENCOUNTER — Encounter: Payer: Self-pay | Admitting: Family Medicine

## 2019-05-23 VITALS — BP 136/70 | HR 62 | Temp 98.1°F | Ht 63.0 in | Wt 139.6 lb

## 2019-05-23 DIAGNOSIS — M8580 Other specified disorders of bone density and structure, unspecified site: Secondary | ICD-10-CM

## 2019-05-23 DIAGNOSIS — Z23 Encounter for immunization: Secondary | ICD-10-CM | POA: Diagnosis not present

## 2019-05-23 DIAGNOSIS — E782 Mixed hyperlipidemia: Secondary | ICD-10-CM | POA: Diagnosis not present

## 2019-05-23 LAB — BASIC METABOLIC PANEL
BUN: 20 mg/dL (ref 6–23)
CO2: 30 mEq/L (ref 19–32)
Calcium: 9.3 mg/dL (ref 8.4–10.5)
Chloride: 102 mEq/L (ref 96–112)
Creatinine, Ser: 0.88 mg/dL (ref 0.40–1.20)
GFR: 62.39 mL/min (ref >60.00)
Glucose, Bld: 101 mg/dL — ABNORMAL HIGH (ref 70–99)
Potassium: 4.2 mEq/L (ref 3.5–5.1)
Sodium: 140 mEq/L (ref 135–145)

## 2019-05-23 LAB — LIPID PANEL
Cholesterol: 220 mg/dL — ABNORMAL HIGH (ref 0–200)
HDL: 71.5 mg/dL (ref >39.00)
LDL Cholesterol: 132 mg/dL — ABNORMAL HIGH (ref 0–99)
NonHDL: 148.01
Total CHOL/HDL Ratio: 3
Triglycerides: 80 mg/dL (ref 0.0–149.0)
VLDL: 16 mg/dL (ref 0.0–40.0)

## 2019-05-23 LAB — AST: AST: 14 U/L (ref 0–37)

## 2019-05-23 LAB — VITAMIN D 25 HYDROXY (VIT D DEFICIENCY, FRACTURES): VITD: 52.23 ng/mL (ref 30.00–100.00)

## 2019-05-23 LAB — ALT: ALT: 11 U/L (ref 0–35)

## 2019-05-23 NOTE — Patient Instructions (Signed)
Vit D 2000 units daily Calcium 1200mg  daily

## 2019-05-23 NOTE — Progress Notes (Signed)
Kirsten Sullivan is a 78 y.o. female  Chief Complaint  Patient presents with  . Follow-up    no complaints/ labs-fasting/ wants flu shot     HPI: Kirsten Sullivan is a 78 y.o. female here for f/u on hyperlipidemia and to get flu vaccine. She still walks almost daily. Appetite is good. Sleeping well. No falls. Mo GI issues. Pt is taking simvastatin 5mg  daily. Dexa scan in 04/2019 showed osteopenia with T-score -1.3. pt is taking calcium and Vit D supplements.   Past Medical History:  Diagnosis Date  . Adenomatous polyps    colonoscopy 07/07/2011 [place] Marcelle [place]  . Anemia   . Fibrocystic breast   . Hyperlipidemia     Past Surgical History:  Procedure Laterality Date  . ABDOMINAL HYSTERECTOMY    . BREAST BIOPSY     right- benign  . COLONOSCOPY    . POLYPECTOMY      Social History   Socioeconomic History  . Marital status: Single    Spouse name: Not on file  . Number of children: Not on file  . Years of education: Not on file  . Highest education level: Not on file  Occupational History  . Not on file  Social Needs  . Financial resource strain: Not on file  . Food insecurity    Worry: Not on file    Inability: Not on file  . Transportation needs    Medical: Not on file    Non-medical: Not on file  Tobacco Use  . Smoking status: Never Smoker  . Smokeless tobacco: Never Used  Substance and Sexual Activity  . Alcohol use: No  . Drug use: No  . Sexual activity: Not Currently  Lifestyle  . Physical activity    Days per week: Not on file    Minutes per session: Not on file  . Stress: Not on file  Relationships  . Social Herbalist on phone: Not on file    Gets together: Not on file    Attends religious service: Not on file    Active member of club or organization: Not on file    Attends meetings of clubs or organizations: Not on file    Relationship status: Not on file  . Intimate partner violence    Fear of current or ex  partner: Not on file    Emotionally abused: Not on file    Physically abused: Not on file    Forced sexual activity: Not on file  Other Topics Concern  . Not on file  Social History Narrative  . Not on file    Family History  Problem Relation Age of Onset  . Alzheimer's disease Mother   . Prostate cancer Father   . Macular degeneration Father   . Hypertension Father   . Gout Maternal Grandmother   . Heart disease Maternal Grandfather 84  . Colitis Sister   . Colon cancer Neg Hx   . Colon polyps Neg Hx   . Rectal cancer Neg Hx   . Stomach cancer Neg Hx   . Breast cancer Neg Hx      Immunization History  Administered Date(s) Administered  . Influenza Split 07/11/2012, 08/04/2015  . Influenza,inj,Quad PF,6+ Mos 07/05/2013, 07/25/2014  . Pneumococcal Conjugate-13 08/18/2014  . Pneumococcal Polysaccharide-23 05/16/2013  . Tdap 02/21/2012  . Zoster 10/04/2007    Outpatient Encounter Medications as of 05/23/2019  Medication Sig  . Calcium Carb-Cholecalciferol (CALCIUM+D3) 500-600 MG-UNIT TABS Take  by mouth.   . cholecalciferol (VITAMIN D) 1000 units tablet Take 2,000 Units by mouth daily.   . Cyanocobalamin (VITAMIN B-12 CR PO) Take 1,000 mcg by mouth daily.   . Multiple Vitamins-Minerals (CENTRUM SILVER PO) Take 1 tablet by mouth daily.  . Omega-3 Fatty Acids (FISH OIL TRIPLE STRENGTH) 1400 MG CAPS Take 1,200 mg by mouth daily.   . simvastatin (ZOCOR) 5 MG tablet Take 1 tablet (5 mg total) by mouth at bedtime. Take one tablet Mon, Weds, Friday and Saturday  . vitamin C (ASCORBIC ACID) 500 MG tablet Take 500 mg by mouth daily.  Marland Kitchen VITAMIN E PO Take 800 mg by mouth daily.   Facility-Administered Encounter Medications as of 05/23/2019  Medication  . 0.9 %  sodium chloride infusion     ROS: Gen: no fever, chills  Skin: no rash, itching ENT: no ear pain, ear drainage, nasal congestion, rhinorrhea, sinus pressure, sore throat Eyes: no blurry vision, double vision Resp: no  cough, wheeze,SOB CV: no CP, palpitations, LE edema,  GI: no heartburn, n/v/d/c, abd pain GU: no dysuria, urgency, frequency, hematuria  MSK: no joint pain, myalgias, back pain Neuro: no dizziness, headache, weakness, vertigo Psych: no depression, anxiety, insomnia   No Known Allergies  BP 136/70   Pulse 62   Temp 98.1 F (36.7 C) (Oral)   Ht 5\' 3"  (1.6 m)   Wt 139 lb 9.6 oz (63.3 kg)   SpO2 96%   BMI 24.73 kg/m   Physical Exam  Constitutional: She is oriented to person, place, and time. She appears well-developed and well-nourished. No distress.  Neck: Neck supple. No thyromegaly present.  Cardiovascular: Normal rate, regular rhythm and normal heart sounds.  Pulmonary/Chest: Effort normal and breath sounds normal. No respiratory distress.  Abdominal: Soft. Bowel sounds are normal. She exhibits no distension. There is no abdominal tenderness.  Musculoskeletal:        General: No edema.  Lymphadenopathy:    She has no cervical adenopathy.  Neurological: She is alert and oriented to person, place, and time.  Psychiatric: She has a normal mood and affect. Her behavior is normal.     A/P:  1. Mixed hyperlipidemia - cont simvastatin 5mg  daily - cont regular walking and overall healthy diet - ALT - AST - Basic metabolic panel - Lipid panel  2. Need for influenza vaccination - Flu Vaccine QUAD High Dose(Fluad)  3. Osteopenia, unspecified location - cont calcium 1200mg  daily and Vit D 2000IU daily - VITAMIN D 25 Hydroxy (Vit-D Deficiency, Fractures)  Pt would like to continue to f/u q21mo

## 2019-05-30 MED FILL — SIMVASTATIN 5 MG TABS: 5 | 90 days supply | Qty: 51 | Fill #0

## 2019-07-12 ENCOUNTER — Telehealth: Payer: Self-pay | Admitting: Family Medicine

## 2019-07-12 DIAGNOSIS — E782 Mixed hyperlipidemia: Secondary | ICD-10-CM

## 2019-07-12 MED ORDER — SIMVASTATIN 10 MG PO TABS: 10.0000 mg | ORAL_TABLET | Freq: Every day | ORAL | 3 refills | Status: DC

## 2019-07-12 MED FILL — SIMVASTATIN 10 MG TAB: 10 | 90 days supply | Qty: 48 | Fill #0

## 2019-07-12 NOTE — Telephone Encounter (Signed)
Rx sent to pharm on file for simvastatin 10mg  daily #90 w/ 3RF

## 2019-07-12 NOTE — Telephone Encounter (Signed)
Dr. Loletha Grayer,  I spoke with pt and she states you informed her to increase her simvastatin from 5mg  to 10mg . She is unable to get a refill for the 5mg . She wants to know if you can send in a rx for 10mg  instead

## 2019-07-12 NOTE — Telephone Encounter (Signed)
Patient wants to speak with Nurse of Dr Linford Arnold. She wants talk about possibly changing medication . Please advise  Call back number [phone]

## 2019-07-12 NOTE — Telephone Encounter (Signed)
Spoke with pt and informed her of Dr. Lurline Del message. Pt had no additional questions.

## 2019-09-04 ENCOUNTER — Other Ambulatory Visit: Payer: Self-pay | Admitting: Family Medicine

## 2019-09-04 DIAGNOSIS — Z1231 Encounter for screening mammogram for malignant neoplasm of breast: Secondary | ICD-10-CM

## 2019-10-10 ENCOUNTER — Encounter: Payer: Self-pay | Admitting: Internal Medicine

## 2019-10-15 ENCOUNTER — Ambulatory Visit: Payer: Medicare Other | Attending: Internal Medicine

## 2019-10-15 DIAGNOSIS — Z23 Encounter for immunization: Secondary | ICD-10-CM | POA: Insufficient documentation

## 2019-10-15 MED FILL — SIMVASTATIN 10 MG TAB: 10 | 90 days supply | Qty: 48 | Fill #1

## 2019-10-15 NOTE — Progress Notes (Signed)
   Covid-19 Vaccination Clinic  Name:  Kirsten Sullivan    MRN: [id number] DOB: 30-Oct-1940  10/15/2019  [name] was observed post Covid-19 immunization for 15 minutes without incidence. She was provided with Vaccine Information Sheet and instruction to access the V-Safe system.   [name] was instructed to call 911 with any severe reactions post vaccine: Marland Kitchen Difficulty breathing  . Swelling of your face and throat  . A fast heartbeat  . A bad rash all over your body  . Dizziness and weakness    Immunizations Administered    Name Date Dose VIS Date Route   Pfizer COVID-19 Vaccine 10/15/2019 11:08 AM 0.3 mL 09/13/2019 Intramuscular   Manufacturer: [employer], [employer]   Lot: [id number]   Ali Molina: [id number]

## 2019-10-25 ENCOUNTER — Ambulatory Visit: Payer: Medicare Other

## 2019-11-04 ENCOUNTER — Ambulatory Visit: Payer: Medicare Other | Attending: Internal Medicine

## 2019-11-04 DIAGNOSIS — Z23 Encounter for immunization: Secondary | ICD-10-CM

## 2019-11-04 NOTE — Progress Notes (Signed)
   Covid-19 Vaccination Clinic  Name:  Kirsten Sullivan    MRN: [id number] DOB: 1940/10/07  11/04/2019  [name] was observed post Covid-19 immunization for 15 minutes without incidence. She was provided with Vaccine Information Sheet and instruction to access the V-Safe system.   [name] was instructed to call 911 with any severe reactions post vaccine: Marland Kitchen Difficulty breathing  . Swelling of your face and throat  . A fast heartbeat  . A bad rash all over your body  . Dizziness and weakness    Immunizations Administered    Name Date Dose VIS Date Route   Pfizer COVID-19 Vaccine 11/04/2019 11:03 AM 0.3 mL 09/13/2019 Intramuscular   Manufacturer: [employer]   Lot: [id number]   [facility]: [id number]

## 2019-11-14 ENCOUNTER — Other Ambulatory Visit: Payer: Self-pay

## 2019-11-15 ENCOUNTER — Ambulatory Visit (INDEPENDENT_AMBULATORY_CARE_PROVIDER_SITE_OTHER): Payer: Medicare Other | Admitting: Family Medicine

## 2019-11-15 ENCOUNTER — Encounter: Payer: Self-pay | Admitting: Family Medicine

## 2019-11-15 VITALS — BP 140/70 | HR 65 | Temp 96.9°F | Ht 63.5 in | Wt 142.8 lb

## 2019-11-15 DIAGNOSIS — Z0000 Encounter for general adult medical examination without abnormal findings: Secondary | ICD-10-CM | POA: Diagnosis not present

## 2019-11-15 DIAGNOSIS — M8580 Other specified disorders of bone density and structure, unspecified site: Secondary | ICD-10-CM | POA: Diagnosis not present

## 2019-11-15 DIAGNOSIS — E782 Mixed hyperlipidemia: Secondary | ICD-10-CM

## 2019-11-15 DIAGNOSIS — R7301 Impaired fasting glucose: Secondary | ICD-10-CM | POA: Diagnosis not present

## 2019-11-15 LAB — BASIC METABOLIC PANEL
BUN: 20 mg/dL (ref 6–23)
CO2: 32 mEq/L (ref 19–32)
Calcium: 8.9 mg/dL (ref 8.4–10.5)
Chloride: 101 mEq/L (ref 96–112)
Creatinine, Ser: 0.82 mg/dL (ref 0.40–1.20)
GFR: 67.6 mL/min (ref >60.00)
Glucose, Bld: 96 mg/dL (ref 70–99)
Potassium: 4.1 mEq/L (ref 3.5–5.1)
Sodium: 138 mEq/L (ref 135–145)

## 2019-11-15 LAB — ALT: ALT: 12 U/L (ref 0–35)

## 2019-11-15 LAB — HEMOGLOBIN A1C: Hgb A1c MFr Bld: 6 % (ref 4.6–6.5)

## 2019-11-15 LAB — LIPID PANEL
Cholesterol: 208 mg/dL — ABNORMAL HIGH (ref 0–200)
HDL: 71.1 mg/dL (ref >39.00)
LDL Cholesterol: 120 mg/dL — ABNORMAL HIGH (ref 0–99)
NonHDL: 137.35
Total CHOL/HDL Ratio: 3
Triglycerides: 86 mg/dL (ref 0.0–149.0)
VLDL: 17.2 mg/dL (ref 0.0–40.0)

## 2019-11-15 LAB — AST: AST: 15 U/L (ref 0–37)

## 2019-11-15 NOTE — Progress Notes (Signed)
Kirsten Sullivan is a 79 y.o. female  Chief Complaint  Patient presents with  . Annual Exam    Pt has no complaints today.  Pt is fasting for labs today.    HPI: Kirsten Sullivan is a 79 y.o. female here for her annual exam and f/u on chronic medical issues or hyperlipidemia and osteopenia. She is fasting for labs.  At her last appt/labs in 05/2019, FBS was 101. \ She has no concerns or issues today.   She still walks almost daily, up to 4 miles. Appetite is good. Sleeping well. No falls. No GI issues. Pt is taking simvastatin 5mg  daily. Dexa scan in 04/2019 showed osteopenia with T-score -1.3. pt is taking calcium and Vit D supplements.  Last colonoscopy: 09/2016 - Dr. Carlean Purl with [facility] - does not need further CRC screening (see letter in chart from 10/10/19) She has a mammo appt scheduled for 11/26/19.  Pt received COVID vaccine series.   Past Medical History:  Diagnosis Date  . Adenomatous polyps    colonoscopy 07/07/2011 Dr. Marcelle [place]  . Anemia   . Fibrocystic breast   . Hyperlipidemia     Past Surgical History:  Procedure Laterality Date  . ABDOMINAL HYSTERECTOMY    . BREAST BIOPSY     right- benign  . COLONOSCOPY    . POLYPECTOMY      Social History   Socioeconomic History  . Marital status: Single    Spouse name: Not on file  . Number of children: Not on file  . Years of education: Not on file  . Highest education level: Not on file  Occupational History  . Not on file  Tobacco Use  . Smoking status: Never Smoker  . Smokeless tobacco: Never Used  Substance and Sexual Activity  . Alcohol use: No  . Drug use: No  . Sexual activity: Not Currently  Other Topics Concern  . Not on file  Social History Narrative  . Not on file   Social Determinants of Health   Financial Resource Strain:   . Difficulty of Paying Living Expenses: Not on file  Food Insecurity:   . Worried About Charity fundraiser in the Last Year: Not on file  . Ran  Out of Food in the Last Year: Not on file  Transportation Needs:   . Lack of Transportation (Medical): Not on file  . Lack of Transportation (Non-Medical): Not on file  Physical Activity:   . Days of Exercise per Week: Not on file  . Minutes of Exercise per Session: Not on file  Stress:   . Feeling of Stress : Not on file  Social Connections:   . Frequency of Communication with Friends and Family: Not on file  . Frequency of Social Gatherings with Friends and Family: Not on file  . Attends Religious Services: Not on file  . Active Member of Clubs or Organizations: Not on file  . Attends Archivist Meetings: Not on file  . Marital Status: Not on file  Intimate Partner Violence:   . Fear of Current or Ex-Partner: Not on file  . Emotionally Abused: Not on file  . Physically Abused: Not on file  . Sexually Abused: Not on file    Family History  Problem Relation Age of Onset  . Alzheimer's disease Mother   . Prostate cancer Father   . Macular degeneration Father   . Hypertension Father   . Gout Maternal Grandmother   . Heart  disease Maternal Grandfather 44  . Colitis Sister   . Colon cancer Neg Hx   . Colon polyps Neg Hx   . Rectal cancer Neg Hx   . Stomach cancer Neg Hx   . Breast cancer Neg Hx      Immunization History  Administered Date(s) Administered  . Fluad Quad(high Dose 65+) 05/23/2019  . Influenza Split 07/11/2012, 08/04/2015  . Influenza, High Dose Seasonal PF 08/04/2015  . Influenza,inj,Quad PF,6+ Mos 07/05/2013, 07/25/2014  . PFIZER SARS-COV-2 Vaccination 10/15/2019, 11/04/2019  . Pneumococcal Conjugate-13 08/18/2014  . Pneumococcal Polysaccharide-23 05/16/2013  . Tdap 02/21/2012  . Zoster 10/04/2007    Outpatient Encounter Medications as of 11/15/2019  Medication Sig  . CALCIUM CARB-CHOLECALCIFEROL PO Take by mouth. 500mg -89mcg  . cholecalciferol (VITAMIN D) 1000 units tablet Take 2,000 Units by mouth daily. D3  . Cyanocobalamin (VITAMIN B-12  CR PO) Take 5,000 mcg by mouth daily.   . Multiple Vitamins-Minerals (CENTRUM SILVER PO) Take 1 tablet by mouth daily.  . Omega-3 Fatty Acids (FISH OIL TRIPLE STRENGTH) 1400 MG CAPS Take 1,200 mg by mouth daily.   . simvastatin (ZOCOR) 10 MG tablet Take 1 tablet (10 mg total) by mouth at bedtime. Take one tablet Mon, Weds, Friday and Saturday  . vitamin C (ASCORBIC ACID) 500 MG tablet Take 500 mg by mouth daily.  Marland Kitchen VITAMIN E PO Take 800 mg by mouth daily.   Facility-Administered Encounter Medications as of 11/15/2019  Medication  . 0.9 %  sodium chloride infusion     ROS: Gen: no fever, chills  Skin: no rash, itching ENT: no ear pain, ear drainage, nasal congestion, rhinorrhea, sinus pressure, sore throat Eyes: no blurry vision, double vision Resp: no cough, wheeze,SOB CV: no CP, palpitations, LE edema,  GI: no heartburn, n/v/d/c, abd pain GU: no dysuria, urgency, frequency, hematuria MSK: no joint pain, myalgias, back pain Neuro: no dizziness, headache, weakness Psych: no depression, anxiety, insomnia   No Known Allergies  BP 140/70 (BP Location: Left Arm, Patient Position: Sitting, Cuff Size: Normal)   Pulse 65   Temp (!) 96.9 F (36.1 C) (Temporal)   Ht 5' 3.5" (1.613 m)   Wt 142 lb 12.8 oz (64.8 kg)   SpO2 99%   BMI 24.90 kg/m  BP Readings from Last 3 Encounters:  11/15/19 140/70  05/23/19 136/70  03/22/19 118/82   Wt Readings from Last 3 Encounters:  11/15/19 142 lb 12.8 oz (64.8 kg)  05/23/19 139 lb 9.6 oz (63.3 kg)  03/22/19 142 lb 12.8 oz (64.8 kg)    Physical Exam  Constitutional: She is oriented to person, place, and time. She appears well-developed and well-nourished. No distress.  HENT:  Head: Normocephalic and atraumatic.  Right Ear: Tympanic membrane and ear canal normal.  Left Ear: Tympanic membrane and ear canal normal.  Nose: Nose normal.  Mouth/Throat: Oropharynx is clear and moist and mucous membranes are normal.  Eyes: Pupils are equal,  round, and reactive to light. Conjunctivae are normal.  Neck: No thyromegaly present.  Cardiovascular: Normal rate, regular rhythm, normal heart sounds and intact distal pulses.  No murmur heard. Pulmonary/Chest: Effort normal and breath sounds normal. No respiratory distress. She has no wheezes. She has no rhonchi.  Abdominal: Soft. Bowel sounds are normal. She exhibits no distension and no mass. There is no abdominal tenderness.  Musculoskeletal:        General: No edema.     Cervical back: Neck supple.  Lymphadenopathy:    She has no  cervical adenopathy.  Neurological: She is alert and oriented to person, place, and time. She exhibits normal muscle tone. Coordination normal.  Skin: Skin is warm and dry.  Psychiatric: She has a normal mood and affect. Her behavior is normal.     A/P:  1. Mixed hyperlipidemia - on simvastatin 10mg  daily - Lipid panel - ALT - AST - cont with regular exercise and healthy diet - pt prefers f/u q45mo  2. Osteopenia, unspecified location - cont calcium and Vit D supplements - dexa due 04/2021  3. Adult general medical exam - immunizations UTD, including COVID vaccine - mammo scheduled, dexa UTD - discussed importance of regular CV exercise, healthy diet, adequate sleep - UTD on dental and vision exams  4. Elevated fasting glucose - Basic metabolic panel - Hemoglobin A1c

## 2019-11-15 NOTE — Patient Instructions (Signed)
Health Maintenance Due  Topic Date Due  . COLONOSCOPY Pt received letter that she does not need due to age. 09/16/2019  . MAMMOGRAM Pt has an appointment this month. 10/16/2019    Depression screen [facility] 2/9 11/30/2015 08/06/2014 02/21/2012  Decreased Interest 0 0 0  Down, Depressed, Hopeless 0 0 0  PHQ - 2 Score 0 0 0

## 2019-11-20 ENCOUNTER — Encounter: Payer: Self-pay | Admitting: Family Medicine

## 2019-11-20 ENCOUNTER — Ambulatory Visit: Payer: Medicare Other | Admitting: Family Medicine

## 2019-11-26 ENCOUNTER — Other Ambulatory Visit: Payer: Self-pay

## 2019-11-26 ENCOUNTER — Ambulatory Visit
Admission: RE | Admit: 2019-11-26 | Discharge: 2019-11-26 | Disposition: A | Discharge status: Home / Self Care | Payer: Medicare Other | Source: Ambulatory Visit | Attending: Family Medicine | Admitting: Family Medicine

## 2019-11-26 DIAGNOSIS — Z1231 Encounter for screening mammogram for malignant neoplasm of breast: Secondary | ICD-10-CM

## 2019-11-26 NOTE — Progress Notes (Signed)
Virtual Visit via Audio Note  I connected with patient on 11/27/19 at 11:00 AM EST by audio enabled telemedicine application and verified that I am speaking with the correct person using two identifiers.   THIS ENCOUNTER IS A VIRTUAL VISIT DUE TO COVID-19 - PATIENT WAS NOT SEEN IN THE OFFICE. PATIENT HAS CONSENTED TO VIRTUAL VISIT / TELEMEDICINE VISIT   Location of patient: home  Location of provider: office  I discussed the limitations of evaluation and management by telemedicine and the availability of in person appointments. The patient expressed understanding and agreed to proceed.   Subjective:   Nourah Caulkins is a 79 y.o. female who presents for Medicare Annual (Subsequent) preventive examination.  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  Review of Systems:  Home Safety/Smoke Alarms: Feels safe in home. Smoke alarms in place.  Lives alone in 2 story home. Reports a great support system.    Female:         Mammo- 11/25/18      Dexa scan-04/08/19        CCS-09/15/16. No longer doing routine screening due to age.     Objective:     Vitals: Unable to assess. This visit is enabled though telemedicine due to Covid 19.   Advanced Directives 11/27/2019 09/01/2016 11/30/2015  Does Patient Have a Medical Advance Directive? Yes Yes Yes  Type of Paramedic of [place];Living will [place];Living will [place];Living will  Does patient want to make changes to medical advance directive? No - Patient declined - No - Patient declined  Copy of [place] in Chart? No - copy requested - No - copy requested    Tobacco Social History   Tobacco Use  Smoking Status Never Smoker  Smokeless Tobacco Never Used     Counseling given: Not Answered   Clinical Intake: Pain : No/denies pain     Past Medical History:  Diagnosis Date  . Adenomatous polyps    colonoscopy 07/07/2011 [place].  [place]elle [place]  . Anemia   . Fibrocystic breast   . Hyperlipidemia    Past Surgical History:  Procedure Laterality Date  . ABDOMINAL HYSTERECTOMY    . BREAST BIOPSY     right- benign  . COLONOSCOPY    . POLYPECTOMY     Family History  Problem Relation Age of Onset  . Alzheimer's disease Mother   . Prostate cancer Father   . Macular degeneration Father   . Hypertension Father   . Gout Maternal Grandmother   . Heart disease Maternal Grandfather 87  . Colitis Sister   . Colon cancer Neg Hx   . Colon polyps Neg Hx   . Rectal cancer Neg Hx   . Stomach cancer Neg Hx   . Breast cancer Neg Hx    Social History   Socioeconomic History  . Marital status: Single    Spouse name: Not on file  . Number of children: Not on file  . Years of education: Not on file  . Highest education level: Not on file  Occupational History  . Not on file  Tobacco Use  . Smoking status: Never Smoker  . Smokeless tobacco: Never Used  Substance and Sexual Activity  . Alcohol use: No  . Drug use: No  . Sexual activity: Not Currently  Other Topics Concern  . Not on file  Social History Narrative  . Not on file   Social Determinants of  Health   Financial Resource Strain: Low Risk   . Difficulty of Paying Living Expenses: Not hard at all  Food Insecurity: No Food Insecurity  . Worried About Charity fundraiser in the Last Year: Never true  . Ran Out of Food in the Last Year: Never true  Transportation Needs: No Transportation Needs  . Lack of Transportation (Medical): No  . Lack of Transportation (Non-Medical): No  Physical Activity:   . Days of Exercise per Week: Not on file  . Minutes of Exercise per Session: Not on file  Stress:   . Feeling of Stress : Not on file  Social Connections:   . Frequency of Communication with Friends and Family: Not on file  . Frequency of Social Gatherings with Friends and Family: Not on file  . Attends Religious Services: Not on file  . Active  Member of Clubs or Organizations: Not on file  . Attends Archivist Meetings: Not on file  . Marital Status: Not on file    Outpatient Encounter Medications as of 11/27/2019  Medication Sig  . CALCIUM CARB-CHOLECALCIFEROL PO Take by mouth. 500mg -34mcg  . cholecalciferol (VITAMIN D) 1000 units tablet Take 2,000 Units by mouth daily. D3  . Cyanocobalamin (VITAMIN B-12 CR PO) Take 5,000 mcg by mouth daily.   . Multiple Vitamins-Minerals (CENTRUM SILVER PO) Take 1 tablet by mouth daily.  . Omega-3 Fatty Acids (FISH OIL TRIPLE STRENGTH) 1400 MG CAPS Take 1,200 mg by mouth daily.   . simvastatin (ZOCOR) 10 MG tablet Take 1 tablet (10 mg total) by mouth at bedtime. Take one tablet Mon, Weds, Friday and Saturday  . vitamin C (ASCORBIC ACID) 500 MG tablet Take 500 mg by mouth daily.  Marland Kitchen VITAMIN E PO Take 800 mg by mouth daily.   Facility-Administered Encounter Medications as of 11/27/2019  Medication  . 0.9 %  sodium chloride infusion    Activities of Daily Living In your present state of health, do you have any difficulty performing the following activities: 11/27/2019  Hearing? N  Vision? N  Difficulty concentrating or making decisions? N  Walking or climbing stairs? N  Dressing or bathing? N  Doing errands, shopping? N  Using the Toilet? N  In the past six months, have you accidently leaked urine? N  Do you have problems with loss of bowel control? N  Managing your Medications? N  Managing your Finances? N  Housekeeping or managing your Housekeeping? N  Some recent data might be hidden    Patient Care Team: Ronnald Nian, DO as PCP - General (Family Medicine) Monna Fam, MD as Consulting Physician (Ophthalmology)    Assessment:   This is a routine wellness examination for [place]. Physical assessment deferred to PCP.  Exercise Activities and Dietary recommendations Current Exercise Habits: Home exercise routine, Type of exercise: walking, Time (Minutes): 20,  Frequency (Times/Week): 3, Weekly Exercise (Minutes/Week): 60, Intensity: Mild, Exercise limited by: None identified Diet (meal preparation, eat out, water intake, caffeinated beverages, dairy products, fruits and vegetables): in general, a "healthy" diet  , well balanced   Goals    . Maintain healthy lifestyle and independence       Fall Risk Fall Risk  11/27/2019 11/15/2019 11/30/2015 08/06/2014  Falls in the past year? 0 0 No No  Number falls in past yr: 0 - - -  Injury with Fall? 0 - - -  Follow up Education provided;Falls prevention discussed - - -   Depression Screen PHQ 2/9 Scores  11/27/2019 11/15/2019 11/30/2015 08/06/2014  PHQ - 2 Score 0 0 0 0     Cognitive Function Ad8 score reviewed for issues:  Issues making decisions:no  Less interest in hobbies / activities:no  Repeats questions, stories (family complaining):no  Trouble using ordinary gadgets (microwave, computer, phone):no  Forgets the month or year: no  Mismanaging finances: no  Remembering appts:no  Daily problems with thinking and/or memory:no Ad8 score is=0        Immunization History  Administered Date(s) Administered  . Fluad Quad(high Dose 65+) 05/23/2019  . Influenza Split 07/11/2012, 08/04/2015  . Influenza, High Dose Seasonal PF 08/04/2015  . Influenza,inj,Quad PF,6+ Mos 07/05/2013, 07/25/2014  . PFIZER SARS-COV-2 Vaccination 10/15/2019, 11/04/2019  . Pneumococcal Conjugate-13 08/18/2014  . Pneumococcal Polysaccharide-23 05/16/2013  . Tdap 02/21/2012  . Zoster 10/04/2007   Screening Tests Health Maintenance  Topic Date Due  . COLONOSCOPY  09/16/2019  . MAMMOGRAM  11/25/2020  . TETANUS/TDAP  02/20/2022  . INFLUENZA VACCINE  Completed  . DEXA SCAN  Completed  . PNA vac Low Risk Adult  Completed      Plan:    Please schedule your next medicare wellness visit with me in 1 yr.  Continue to eat heart healthy diet (full of fruits, vegetables, whole grains, lean protein, water--limit  salt, fat, and sugar intake) and increase physical activity as tolerated.  Continue doing brain stimulating activities (puzzles, reading, adult coloring books, staying active) to keep memory sharp.   Bring a copy of your living will and/or healthcare power of attorney to your next office visit.   I have personally reviewed and noted the following in the patient's chart:   . Medical and social history . Use of alcohol, tobacco or illicit drugs  . Current medications and supplements . Functional ability and status . Nutritional status . Physical activity . Advanced directives . List of other physicians . Hospitalizations, surgeries, and ER visits in previous 12 months . Vitals . Screenings to include cognitive, depression, and falls . Referrals and appointments  In addition, I have reviewed and discussed with patient certain preventive protocols, quality metrics, and best practice recommendations. A written personalized care plan for preventive services as well as general preventive health recommendations were provided to patient.     Naaman Plummer [place], [place]  11/27/2019

## 2019-11-27 ENCOUNTER — Ambulatory Visit (INDEPENDENT_AMBULATORY_CARE_PROVIDER_SITE_OTHER): Payer: Medicare Other | Admitting: *Deleted

## 2019-11-27 ENCOUNTER — Encounter: Payer: Self-pay | Admitting: *Deleted

## 2019-11-27 DIAGNOSIS — Z0000 Encounter for general adult medical examination without abnormal findings: Secondary | ICD-10-CM | POA: Diagnosis not present

## 2019-11-27 NOTE — Patient Instructions (Signed)
Please schedule your next medicare wellness visit with me in 1 yr.  Continue to eat heart healthy diet (full of fruits, vegetables, whole grains, lean protein, water--limit salt, fat, and sugar intake) and increase physical activity as tolerated.  Continue doing brain stimulating activities (puzzles, reading, adult coloring books, staying active) to keep memory sharp.   Bring a copy of your living will and/or healthcare power of attorney to your next office visit.   Kirsten Sullivan , Thank you for taking time to come for your Medicare Wellness Visit. I appreciate your ongoing commitment to your health goals. Please review the following plan we discussed and let me know if I can assist you in the future.   These are the goals we discussed: Goals    . Maintain healthy lifestyle and independence       This is a list of the screening recommended for you and due dates:  Health Maintenance  Topic Date Due  . Colon Cancer Screening  09/16/2019  . Mammogram  11/25/2020  . Tetanus Vaccine  02/20/2022  . Flu Shot  Completed  . DEXA scan (bone density measurement)  Completed  . Pneumonia vaccines  Completed    Preventive Care 52 Years and Older, Female Preventive care refers to lifestyle choices and visits with your health care provider that can promote health and wellness. This includes:  A yearly physical exam. This is also called an annual well check.  Regular dental and eye exams.  Immunizations.  Screening for certain conditions.  Healthy lifestyle choices, such as diet and exercise. What can I expect for my preventive care visit? Physical exam Your health care provider will check:  Height and weight. These may be used to calculate body mass index (BMI), which is a measurement that tells if you are at a healthy weight.  Heart rate and blood pressure.  Your skin for abnormal spots. Counseling Your health care provider may ask you questions about:  Alcohol, tobacco, and drug  use.  Emotional well-being.  Home and relationship well-being.  Sexual activity.  Eating habits.  History of falls.  Memory and ability to understand (cognition).  Work and work Statistician.  Pregnancy and menstrual history. What immunizations do I need?  Influenza (flu) vaccine  This is recommended every year. Tetanus, diphtheria, and pertussis (Tdap) vaccine  You may need a Td booster every 10 years. Varicella (chickenpox) vaccine  You may need this vaccine if you have not already been vaccinated. Zoster (shingles) vaccine  You may need this after age 73. Pneumococcal conjugate (PCV13) vaccine  One dose is recommended after age 64. Pneumococcal polysaccharide (PPSV23) vaccine  One dose is recommended after age 65. Measles, mumps, and rubella (MMR) vaccine  You may need at least one dose of MMR if you were born in 1957 or later. You may also need a second dose. Meningococcal conjugate (MenACWY) vaccine  You may need this if you have certain conditions. Hepatitis A vaccine  You may need this if you have certain conditions or if you travel or work in places where you may be exposed to hepatitis A. Hepatitis B vaccine  You may need this if you have certain conditions or if you travel or work in places where you may be exposed to hepatitis B. Haemophilus influenzae type b (Hib) vaccine  You may need this if you have certain conditions. You may receive vaccines as individual doses or as more than one vaccine together in one shot (combination vaccines). Talk with your  health care provider about the risks and benefits of combination vaccines. What tests do I need? Blood tests  Lipid and cholesterol levels. These may be checked every 5 years, or more frequently depending on your overall health.  Hepatitis C test.  Hepatitis B test. Screening  Lung cancer screening. You may have this screening every year starting at age 28 if you have a 30-pack-year history of  smoking and currently smoke or have quit within the past 15 years.  Colorectal cancer screening. All adults should have this screening starting at age 16 and continuing until age 66. Your health care provider may recommend screening at age 47 if you are at increased risk. You will have tests every 1-10 years, depending on your results and the type of screening test.  Diabetes screening. This is done by checking your blood sugar (glucose) after you have not eaten for a while (fasting). You may have this done every 1-3 years.  Mammogram. This may be done every 1-2 years. Talk with your health care provider about how often you should have regular mammograms.  BRCA-related cancer screening. This may be done if you have a family history of breast, ovarian, tubal, or peritoneal cancers. Other tests  Sexually transmitted disease (STD) testing.  Bone density scan. This is done to screen for osteoporosis. You may have this done starting at age 70. Follow these instructions at home: Eating and drinking  Eat a diet that includes fresh fruits and vegetables, whole grains, lean protein, and low-fat dairy products. Limit your intake of foods with high amounts of sugar, saturated fats, and salt.  Take vitamin and mineral supplements as recommended by your health care provider.  Do not drink alcohol if your health care provider tells you not to drink.  If you drink alcohol: ? Limit how much you have to 0-1 drink a day. ? Be aware of how much alcohol is in your drink. In the [place] one drink equals one 12 oz bottle of beer (355 mL), one 5 oz glass of wine (148 mL), or one 1 oz glass of hard liquor (44 mL). Lifestyle  Take daily care of your teeth and gums.  Stay active. Exercise for at least 30 minutes on 5 or more days each week.  Do not use any products that contain nicotine or tobacco, such as cigarettes, e-cigarettes, and chewing tobacco. If you need help quitting, ask your health care  provider.  If you are sexually active, practice safe sex. Use a condom or other form of protection in order to prevent STIs (sexually transmitted infections).  Talk with your health care provider about taking a low-dose aspirin or statin. What's next?  Go to your health care provider once a year for a well check visit.  Ask your health care provider how often you should have your eyes and teeth checked.  Stay up to date on all vaccines. This information is not intended to replace advice given to you by your health care provider. Make sure you discuss any questions you have with your health care provider. Document Revised: 09/13/2018 Document Reviewed: 09/13/2018 Elsevier Patient Education  2020 [employer].

## 2020-01-07 MED FILL — SIMVASTATIN 10 MG TAB: 10 | 90 days supply | Qty: 48 | Fill #2

## 2020-04-01 MED FILL — SIMVASTATIN 10 MG TAB: 10 | 90 days supply | Qty: 48 | Fill #3

## 2020-05-15 ENCOUNTER — Ambulatory Visit: Payer: Medicare Other | Admitting: Family Medicine

## 2020-05-19 ENCOUNTER — Other Ambulatory Visit: Payer: Self-pay

## 2020-05-20 ENCOUNTER — Encounter: Payer: Self-pay | Admitting: Family Medicine

## 2020-05-20 ENCOUNTER — Ambulatory Visit: Payer: Medicare Other | Admitting: Family Medicine

## 2020-05-20 VITALS — BP 112/68 | HR 98 | Temp 97.4°F | Ht 63.5 in | Wt 139.0 lb

## 2020-05-20 DIAGNOSIS — R7301 Impaired fasting glucose: Secondary | ICD-10-CM

## 2020-05-20 DIAGNOSIS — E782 Mixed hyperlipidemia: Secondary | ICD-10-CM | POA: Diagnosis not present

## 2020-05-20 DIAGNOSIS — M8580 Other specified disorders of bone density and structure, unspecified site: Secondary | ICD-10-CM

## 2020-05-20 DIAGNOSIS — Z23 Encounter for immunization: Secondary | ICD-10-CM

## 2020-05-20 LAB — LIPID PANEL
Cholesterol: 180 mg/dL (ref 0–200)
HDL: 72.1 mg/dL (ref >39.00)
LDL Cholesterol: 94 mg/dL (ref 0–99)
NonHDL: 107.45
Total CHOL/HDL Ratio: 2
Triglycerides: 65 mg/dL (ref 0.0–149.0)
VLDL: 13 mg/dL (ref 0.0–40.0)

## 2020-05-20 LAB — VITAMIN D 25 HYDROXY (VIT D DEFICIENCY, FRACTURES): VITD: 43.5 ng/mL (ref 30.00–100.00)

## 2020-05-20 LAB — HEMOGLOBIN A1C: Hgb A1c MFr Bld: 5.9 % (ref 4.6–6.5)

## 2020-05-20 NOTE — Progress Notes (Signed)
Kirsten Sullivan is a 79 y.o. female  Chief Complaint  Patient presents with  . Follow-up    6 mo.-pt is fasting//pt did mention Shingrix    HPI: Kirsten Sullivan is a 79 y.o. female here for routine 31mo f/u on hyperlipidemia, IFG, osteopenia.  Last Dexa 04/2019. She is taking calcium with Vit D (unsure of dosage)  Diet: appetite is good, diet is well-balanced Exercise/acitivty: pt walks 4 miles a few times per week, does yard work with group from church  She denies HA, dizziness, CP, SOB, palpitations, LE edema, urinary symptoms.  Pt would like shingrix vaccine #1 today.   Lab Results  Component Value Date   CHOL 208 (H) 11/15/2019   HDL 71.10 11/15/2019   LDLCALC 120 (H) 11/15/2019   TRIG 86.0 11/15/2019   CHOLHDL 3 11/15/2019   Lab Results  Component Value Date   HGBA1C 6.0 11/15/2019     Past Medical History:  Diagnosis Date  . Adenomatous polyps    colonoscopy 07/07/2011 [place]elle [place]  . Anemia   . Fibrocystic breast   . Hyperlipidemia     Past Surgical History:  Procedure Laterality Date  . ABDOMINAL HYSTERECTOMY    . BREAST BIOPSY     right- benign  . COLONOSCOPY    . POLYPECTOMY      Social History   Socioeconomic History  . Marital status: Single    Spouse name: Not on file  . Number of children: Not on file  . Years of education: Not on file  . Highest education level: Not on file  Occupational History  . Not on file  Tobacco Use  . Smoking status: Never Smoker  . Smokeless tobacco: Never Used  Substance and Sexual Activity  . Alcohol use: No  . Drug use: No  . Sexual activity: Not Currently  Other Topics Concern  . Not on file  Social History Narrative  . Not on file   Social Determinants of Health   Financial Resource Strain: Low Risk   . Difficulty of Paying Living Expenses: Not hard at all  Food Insecurity: No Food Insecurity  . Worried About Charity fundraiser in the Last Year: Never true  . Ran Out  of Food in the Last Year: Never true  Transportation Needs: No Transportation Needs  . Lack of Transportation (Medical): No  . Lack of Transportation (Non-Medical): No  Physical Activity:   . Days of Exercise per Week:   . Minutes of Exercise per Session:   Stress:   . Feeling of Stress :   Social Connections:   . Frequency of Communication with Friends and Family:   . Frequency of Social Gatherings with Friends and Family:   . Attends Religious Services:   . Active Member of Clubs or Organizations:   . Attends Archivist Meetings:   Marland Kitchen Marital Status:   Intimate Partner Violence:   . Fear of Current or Ex-Partner:   . Emotionally Abused:   Marland Kitchen Physically Abused:   . Sexually Abused:     Family History  Problem Relation Age of Onset  . Alzheimer's disease Mother   . Prostate cancer Father   . Macular degeneration Father   . Hypertension Father   . Gout Maternal Grandmother   . Heart disease Maternal Grandfather 61  . Colitis Sister   . Colon cancer Neg Hx   . Colon polyps Neg Hx   . Rectal cancer Neg Hx   .  Stomach cancer Neg Hx   . Breast cancer Neg Hx      Immunization History  Administered Date(s) Administered  . Fluad Quad(high Dose 65+) 05/23/2019  . Influenza Split 07/11/2012, 08/04/2015  . Influenza, High Dose Seasonal PF 08/04/2015  . Influenza,inj,Quad PF,6+ Mos 07/05/2013, 07/25/2014  . PFIZER SARS-COV-2 Vaccination 10/15/2019, 11/04/2019  . Pneumococcal Conjugate-13 08/18/2014  . Pneumococcal Polysaccharide-23 05/16/2013  . Tdap 02/21/2012  . Zoster 10/04/2007    Outpatient Encounter Medications as of 05/20/2020  Medication Sig  . CALCIUM CARB-CHOLECALCIFEROL PO Take by mouth. 500mg -85mcg  . Cyanocobalamin (VITAMIN B-12 CR PO) Take 5,000 mcg by mouth daily.   . Multiple Vitamins-Minerals (CENTRUM SILVER PO) Take 1 tablet by mouth daily.  . Omega-3 Fatty Acids (FISH OIL TRIPLE STRENGTH) 1400 MG CAPS Take 1,200 mg by mouth daily.   .  simvastatin (ZOCOR) 10 MG tablet Take 1 tablet (10 mg total) by mouth at bedtime. Take one tablet Mon, Weds, Friday and Saturday  . vitamin C (ASCORBIC ACID) 500 MG tablet Take 500 mg by mouth daily.  Marland Kitchen VITAMIN E PO Take 800 mg by mouth daily.  . [DISCONTINUED] cholecalciferol (VITAMIN D) 1000 units tablet Take 2,000 Units by mouth daily. D3   Facility-Administered Encounter Medications as of 05/20/2020  Medication  . 0.9 %  sodium chloride infusion     ROS: Pertinent positives and negatives noted in HPI. Remainder of ROS non-contributory    No Known Allergies  BP 112/68   Pulse 98   Temp (!) 97.4 F (36.3 C) (Tympanic)   Ht 5' 3.5" (1.613 m)   Wt 139 lb (63 kg)   SpO2 (!) 67%   BMI 24.24 kg/m    Wt Readings from Last 3 Encounters:  05/20/20 139 lb (63 kg)  11/15/19 142 lb 12.8 oz (64.8 kg)  05/23/19 139 lb 9.6 oz (63.3 kg)     Physical Exam Constitutional:      General: She is not in acute distress.    Appearance: She is not toxic-appearing.  Cardiovascular:     Rate and Rhythm: Normal rate and regular rhythm.     Pulses: Normal pulses.  Pulmonary:     Effort: Pulmonary effort is normal. No respiratory distress.     Breath sounds: Normal breath sounds.  Musculoskeletal:     Right lower leg: No edema.     Left lower leg: No edema.  Skin:    General: Skin is warm and dry.  Neurological:     Mental Status: She is alert and oriented to person, place, and time.  Psychiatric:        Mood and Affect: Mood normal.        Behavior: Behavior normal.      A/P:  1. Mixed hyperlipidemia - on simvastatin 10mg  4 days/wk, continue - walking consistently, well-balanced diet - Lipid panel  2. Impaired fasting glucose - not on med - walking consistently, well-balanced diet - Hemoglobin A1c  3. Osteopenia, unspecified location - taking calcium with D3 (unsure of how many IU) daily - VITAMIN D 25 Hydroxy (Vit-D Deficiency, Fractures)  4. Need for shingles  vaccine - Varicella-zoster vaccine IM (Shingrix) - RTO in 2-51mo for #2  Will plan on continuing q54mo f/u appts as this is what pt is comfortable with   This visit occurred during the SARS-CoV-2 public health emergency.  Safety protocols were in place, including screening questions prior to the visit, additional usage of staff PPE, and extensive cleaning of exam room  while observing appropriate contact time as indicated for disinfecting solutions.

## 2020-05-29 ENCOUNTER — Other Ambulatory Visit: Payer: Self-pay | Admitting: Sleep Medicine

## 2020-05-29 ENCOUNTER — Other Ambulatory Visit: Payer: Medicare Other

## 2020-05-29 DIAGNOSIS — I471 Supraventricular tachycardia: Secondary | ICD-10-CM

## 2020-05-31 LAB — NOVEL CORONAVIRUS, NAA: SARS-CoV-2, NAA: NOT DETECTED

## 2020-05-31 LAB — SARS-COV-2, NAA 2 DAY TAT

## 2020-06-22 MED FILL — SIMVASTATIN 10 MG TAB: 10 | 90 days supply | Qty: 48 | Fill #4

## 2020-07-20 LAB — HM DIABETES EYE EXAM

## 2020-07-23 ENCOUNTER — Encounter: Payer: Self-pay | Admitting: Family Medicine

## 2020-09-14 ENCOUNTER — Other Ambulatory Visit: Payer: Self-pay | Admitting: Family Medicine

## 2020-09-14 DIAGNOSIS — E782 Mixed hyperlipidemia: Secondary | ICD-10-CM

## 2020-09-14 MED FILL — SIMVASTATIN 10 MG TABLET: 10 | 90 days supply | Qty: 48 | Fill #0

## 2020-09-14 NOTE — Telephone Encounter (Signed)
Last OV 05/20/20 Last fill 07/12/19  #90/3

## 2020-11-02 ENCOUNTER — Other Ambulatory Visit: Payer: Self-pay | Admitting: Family Medicine

## 2020-11-02 DIAGNOSIS — Z1231 Encounter for screening mammogram for malignant neoplasm of breast: Secondary | ICD-10-CM

## 2020-11-20 ENCOUNTER — Encounter: Payer: Self-pay | Admitting: Family Medicine

## 2020-11-20 ENCOUNTER — Other Ambulatory Visit: Payer: Self-pay

## 2020-11-20 ENCOUNTER — Ambulatory Visit: Payer: Medicare Other | Admitting: Family Medicine

## 2020-11-20 VITALS — BP 130/70 | HR 65 | Temp 97.9°F | Ht 63.5 in | Wt 139.6 lb

## 2020-11-20 DIAGNOSIS — E782 Mixed hyperlipidemia: Secondary | ICD-10-CM | POA: Diagnosis not present

## 2020-11-20 DIAGNOSIS — R7301 Impaired fasting glucose: Secondary | ICD-10-CM

## 2020-11-20 LAB — LIPID PANEL
Cholesterol: 194 mg/dL (ref 0–200)
HDL: 80.3 mg/dL (ref >39.00)
LDL Cholesterol: 101 mg/dL — ABNORMAL HIGH (ref 0–99)
NonHDL: 114.18
Total CHOL/HDL Ratio: 2
Triglycerides: 68 mg/dL (ref 0.0–149.0)
VLDL: 13.6 mg/dL (ref 0.0–40.0)

## 2020-11-20 LAB — COMPREHENSIVE METABOLIC PANEL
ALT: 14 U/L (ref 0–35)
AST: 16 U/L (ref 0–37)
Albumin: 4.2 g/dL (ref 3.5–5.2)
Alkaline Phosphatase: 40 U/L (ref 39–117)
BUN: 15 mg/dL (ref 6–23)
CO2: 32 mEq/L (ref 19–32)
Calcium: 9.1 mg/dL (ref 8.4–10.5)
Chloride: 102 mEq/L (ref 96–112)
Creatinine, Ser: 0.82 mg/dL (ref 0.40–1.20)
GFR: 68.71 mL/min (ref >60.00)
Glucose, Bld: 90 mg/dL (ref 70–99)
Potassium: 3.9 mEq/L (ref 3.5–5.1)
Sodium: 140 mEq/L (ref 135–145)
Total Bilirubin: 0.5 mg/dL (ref 0.2–1.2)
Total Protein: 6.5 g/dL (ref 6.0–8.3)

## 2020-11-20 LAB — HEMOGLOBIN A1C: Hgb A1c MFr Bld: 5.8 % (ref 4.6–6.5)

## 2020-11-20 NOTE — Progress Notes (Signed)
Kirsten Sullivan is a 80 y.o. female  Chief Complaint  Patient presents with  . Follow-up    6 month f/u.  No concerns.  No refills needed today.     [id number] Kirsten Sullivan is a 80 y.o. female seen today for routine f/u in IFG and mixed hyperlipidemia. She has no acute issues or concerns today. No changes in health.  For HLD pt is taking zocor 10mg  4 days per week. No refill needed. Pt is not on any medication for IFG. She is due for labs today.  Diet: appetite is good, overall healthy diet Exercise: no issues, walks regularly, mows lawn Sleep is good.   Lab Results  Component Value Date   HGBA1C 5.9 05/20/2020   Lab Results  Component Value Date   CHOL 180 05/20/2020   HDL 72.10 05/20/2020   LDLCALC 94 05/20/2020   TRIG 65.0 05/20/2020   CHOLHDL 2 05/20/2020   Lab Results  Component Value Date   NA 138 11/15/2019   K 4.1 11/15/2019   CREATININE 0.82 11/15/2019   GFRNONAA 67 07/25/2014   GFRAA 78 07/25/2014   GLUCOSE 96 11/15/2019   Lab Results  Component Value Date   ALT 12 11/15/2019   AST 15 11/15/2019   ALKPHOS 40 12/02/2015   BILITOT 0.5 12/02/2015     Past Medical History:  Diagnosis Date  . Adenomatous polyps    colonoscopy 07/07/2011 Dr. [place]  . Anemia   . Fibrocystic breast   . Hyperlipidemia     Past Surgical History:  Procedure Laterality Date  . ABDOMINAL HYSTERECTOMY    . BREAST BIOPSY     right- benign  . COLONOSCOPY    . POLYPECTOMY      Social History   Socioeconomic History  . Marital status: Single    Spouse name: Not on file  . Number of children: Not on file  . Years of education: Not on file  . Highest education level: Not on file  Occupational History  . Not on file  Tobacco Use  . Smoking status: Never Smoker  . Smokeless tobacco: Never Used  Substance and Sexual Activity  . Alcohol use: No  . Drug use: No  . Sexual activity: Not Currently  Other Topics Concern  . Not on file  Social  History Narrative  . Not on file   Social Determinants of Health   Financial Resource Strain: Low Risk   . Difficulty of Paying Living Expenses: Not hard at all  Food Insecurity: No Food Insecurity  . Worried About Charity fundraiser in the Last Year: Never true  . Ran Out of Food in the Last Year: Never true  Transportation Needs: No Transportation Needs  . Lack of Transportation (Medical): No  . Lack of Transportation (Non-Medical): No  Physical Activity: Not on file  Stress: Not on file  Social Connections: Not on file  Intimate Partner Violence: Not on file    Family History  Problem Relation Age of Onset  . Alzheimer's disease Mother   . Prostate cancer Father   . Macular degeneration Father   . Hypertension Father   . Gout Maternal Grandmother   . Heart disease Maternal Grandfather 67  . Colitis Sister   . Colon cancer Neg Hx   . Colon polyps Neg Hx   . Rectal cancer Neg Hx   . Stomach cancer Neg Hx   . Breast cancer Neg Hx      Immunization  History  Administered Date(s) Administered  . Fluad Quad(high Dose 65+) 05/23/2019  . Influenza Split 07/11/2012, 08/04/2015  . Influenza, High Dose Seasonal PF 08/04/2015  . Influenza,inj,Quad PF,6+ Mos 07/05/2013, 07/25/2014  . Influenza-Unspecified 07/22/2020  . PFIZER(Purple Top)SARS-COV-2 Vaccination 10/15/2019, 11/04/2019, 05/12/2020  . Pneumococcal Conjugate-13 08/18/2014  . Pneumococcal Polysaccharide-23 05/16/2013  . Tdap 02/21/2012  . Zoster 10/04/2007  . Zoster Recombinat (Shingrix) 05/20/2020    Outpatient Encounter Medications as of 11/20/2020  Medication Sig  . CALCIUM CARB-CHOLECALCIFEROL PO Take by mouth. 500mg -93mcg  . Cyanocobalamin (VITAMIN B-12 CR PO) Take 5,000 mcg by mouth daily.   . Multiple Vitamins-Minerals (CENTRUM SILVER PO) Take 1 tablet by mouth daily.  . Omega-3 Fatty Acids (FISH OIL TRIPLE STRENGTH) 1400 MG CAPS Take 1,200 mg by mouth daily.   . simvastatin (ZOCOR) 10 MG tablet TAKE 1  TABLET (10 MG TOTAL) BY MOUTH AT BEDTIME. TAKE ONE TABLET MON, WEDS, FRIDAY AND SATURDAY  . vitamin C (ASCORBIC ACID) 500 MG tablet Take 500 mg by mouth daily.  Marland Kitchen VITAMIN E PO Take 800 mg by mouth daily.   Facility-Administered Encounter Medications as of 11/20/2020  Medication  . 0.9 %  sodium chloride infusion     ROS: Pertinent positives and negatives noted in HPI. Remainder of ROS non-contributory   No Known Allergies  BP 130/70   Pulse 65   Temp 97.9 F (36.6 C) (Temporal)   Ht 5' 3.5" (1.613 m)   Wt 139 lb 9.6 oz (63.3 kg)   SpO2 96%   BMI 24.34 kg/m   Wt Readings from Last 3 Encounters:  11/20/20 139 lb 9.6 oz (63.3 kg)  05/20/20 139 lb (63 kg)  11/15/19 142 lb 12.8 oz (64.8 kg)   Temp Readings from Last 3 Encounters:  11/20/20 97.9 F (36.6 C) (Temporal)  05/20/20 (!) 97.4 F (36.3 C) (Tympanic)  11/15/19 (!) 96.9 F (36.1 C) (Temporal)   BP Readings from Last 3 Encounters:  11/20/20 130/70  05/20/20 112/68  11/15/19 140/70   Pulse Readings from Last 3 Encounters:  11/20/20 65  05/20/20 98  11/15/19 65     Physical Exam Constitutional:      General: She is not in acute distress.    Appearance: Normal appearance. She is not ill-appearing.  Cardiovascular:     Rate and Rhythm: Normal rate and regular rhythm.     Pulses: Normal pulses.  Pulmonary:     Effort: Pulmonary effort is normal. No respiratory distress.     Breath sounds: Normal breath sounds. No wheezing or rhonchi.  Musculoskeletal:     Right lower leg: No edema.     Left lower leg: No edema.  Neurological:     Mental Status: She is alert and oriented to person, place, and time.  Psychiatric:        Mood and Affect: Mood normal.        Behavior: Behavior normal.      A/P:  1. Mixed hyperlipidemia - on zocor 10mg  4 days per week - last FLP at goal - Lipid panel - Comprehensive metabolic panel - pt prefers q70mo f/u  2. Impaired fasting glucose - not on any meds - Hemoglobin  A1c - Comprehensive metabolic panel - f/u in 6 mo (pt prefers 45mo vs 12 mo) or sooner PRN   This visit occurred during the SARS-CoV-2 public health emergency.  Safety protocols were in place, including screening questions prior to the visit, additional usage of staff PPE, and extensive cleaning  of exam room while observing appropriate contact time as indicated for disinfecting solutions.

## 2020-12-01 ENCOUNTER — Other Ambulatory Visit: Payer: Self-pay

## 2020-12-01 ENCOUNTER — Ambulatory Visit (INDEPENDENT_AMBULATORY_CARE_PROVIDER_SITE_OTHER): Payer: Medicare Other

## 2020-12-01 VITALS — BP 138/68 | HR 68 | Temp 97.6°F | Resp 16 | Ht 63.5 in | Wt 140.6 lb

## 2020-12-01 DIAGNOSIS — Z0000 Encounter for general adult medical examination without abnormal findings: Secondary | ICD-10-CM | POA: Diagnosis not present

## 2020-12-01 NOTE — Progress Notes (Signed)
Subjective:   Kirsten Sullivan is a 80 y.o. female who presents for Medicare Annual (Subsequent) preventive examination.  Review of Systems     Cardiac Risk Factors include: advanced age (>24men, >38 women);dyslipidemia     Objective:    Today's Vitals   12/01/20 1328  BP: 138/68  Pulse: 68  Resp: 16  Temp: 97.6 F (36.4 C)  TempSrc: Temporal  SpO2: 98%  Weight: 140 lb 9.6 oz (63.8 kg)  Height: 5' 3.5" (1.613 m)   Body mass index is 24.52 kg/m.  Advanced Directives 12/01/2020 11/27/2019 09/01/2016 11/30/2015  Does Patient Have a Medical Advance Directive? Yes Yes Yes Yes  Type of Paramedic of [place];Living will [place];Living will [place];Living will [place];Living will  Does patient want to make changes to medical advance directive? - No - Patient declined - No - Patient declined  Copy of [place] in Chart? No - copy requested No - copy requested - No - copy requested    Current Medications (verified) Outpatient Encounter Medications as of 12/01/2020  Medication Sig  . CALCIUM CARB-CHOLECALCIFEROL PO Take by mouth. 500mg -59mcg  . Cyanocobalamin (VITAMIN B-12 CR PO) Take 5,000 mcg by mouth daily.   . Multiple Vitamins-Minerals (CENTRUM SILVER PO) Take 1 tablet by mouth daily.  . Omega-3 Fatty Acids (FISH OIL TRIPLE STRENGTH) 1400 MG CAPS Take 1,200 mg by mouth daily.   . simvastatin (ZOCOR) 10 MG tablet TAKE 1 TABLET (10 MG TOTAL) BY MOUTH AT BEDTIME. TAKE ONE TABLET MON, WEDS, FRIDAY AND SATURDAY  . vitamin C (ASCORBIC ACID) 500 MG tablet Take 500 mg by mouth daily.  Marland Kitchen VITAMIN E PO Take 800 mg by mouth daily.   Facility-Administered Encounter Medications as of 12/01/2020  Medication  . 0.9 %  sodium chloride infusion    Allergies (verified) Patient has no known allergies.   History: Past Medical History:  Diagnosis Date  . Adenomatous polyps     colonoscopy 07/07/2011 Dr. Marcelle [place]  . Anemia   . Fibrocystic breast   . Hyperlipidemia    Past Surgical History:  Procedure Laterality Date  . ABDOMINAL HYSTERECTOMY    . BREAST BIOPSY     right- benign  . COLONOSCOPY    . POLYPECTOMY     Family History  Problem Relation Age of Onset  . Alzheimer's disease Mother   . Prostate cancer Father   . Macular degeneration Father   . Hypertension Father   . Gout Maternal Grandmother   . Heart disease Maternal Grandfather 72  . Colitis Sister   . Colon cancer Neg Hx   . Colon polyps Neg Hx   . Rectal cancer Neg Hx   . Stomach cancer Neg Hx   . Breast cancer Neg Hx    Social History   Socioeconomic History  . Marital status: Single    Spouse name: Not on file  . Number of children: Not on file  . Years of education: Not on file  . Highest education level: Not on file  Occupational History  . Occupation: Retired  Tobacco Use  . Smoking status: Never Smoker  . Smokeless tobacco: Never Used  Substance and Sexual Activity  . Alcohol use: No  . Drug use: No  . Sexual activity: Not Currently  Other Topics Concern  . Not on file  Social History Narrative  . Not on file   Social Determinants of Health  Financial Resource Strain: Low Risk   . Difficulty of Paying Living Expenses: Not hard at all  Food Insecurity: No Food Insecurity  . Worried About Charity fundraiser in the Last Year: Never true  . Ran Out of Food in the Last Year: Never true  Transportation Needs: No Transportation Needs  . Lack of Transportation (Medical): No  . Lack of Transportation (Non-Medical): No  Physical Activity: Insufficiently Active  . Days of Exercise per Week: 1 day  . Minutes of Exercise per Session: 30 min  Stress: No Stress Concern Present  . Feeling of Stress : Not at all  Social Connections: Moderately Integrated  . Frequency of Communication with Friends and Family: More than three times a week  . Frequency of  Social Gatherings with Friends and Family: More than three times a week  . Attends Religious Services: More than 4 times per year  . Active Member of Clubs or Organizations: Yes  . Attends Archivist Meetings: More than 4 times per year  . Marital Status: Never married    Tobacco Counseling Counseling given: Not Answered   Clinical Intake:  Pre-visit preparation completed: Yes  Pain : No/denies pain     Nutritional Status: BMI of 19-24  Normal Nutritional Risks: None Diabetes: No  How often do you need to have someone help you when you read instructions, pamphlets, or other written materials from your doctor or pharmacy?: 1 - Never  Diabetic?No  Interpreter Needed?: No  Information entered by :: Caroleen Hamman LPN   Activities of Daily Living In your present state of health, do you have any difficulty performing the following activities: 12/01/2020  Hearing? N  Vision? N  Difficulty concentrating or making decisions? N  Walking or climbing stairs? N  Dressing or bathing? N  Doing errands, shopping? N  Preparing Food and eating ? N  In the past six months, have you accidently leaked urine? N  Do you have problems with loss of bowel control? N  Managing your Medications? N  Managing your Finances? N  Housekeeping or managing your Housekeeping? N  Some recent data might be hidden    Patient Care Team: Ronnald Nian, DO as PCP - General (Family Medicine) Monna Fam, MD as Consulting Physician (Ophthalmology)  Indicate any recent Medical Services you may have received from other than [facility] providers in the past year (date may be approximate).     Assessment:   This is a routine wellness examination for Kirsten Sullivan.  Hearing/Vision screen  Hearing Screening   125Hz  250Hz  500Hz  1000Hz  2000Hz  3000Hz  4000Hz  6000Hz  8000Hz   Right ear:           Left ear:           Comments: No issues  Vision Screening Comments: [place] eye exam-08/2020-Dr.  Kathlen Mody  Dietary issues and exercise activities discussed: Current Exercise Habits: Home exercise routine, Time (Minutes): 30, Frequency (Times/Week): 2, Weekly Exercise (Minutes/Week): 60, Intensity: Mild, Exercise limited by: None identified  Goals    . Maintain healthy lifestyle and independence      Depression Screen PHQ 2/9 Scores 12/01/2020 11/27/2019 11/15/2019 11/30/2015 08/06/2014 02/21/2012  PHQ - 2 Score 0 0 0 0 0 0    Fall Risk Fall Risk  12/01/2020 11/27/2019 11/15/2019 11/30/2015 08/06/2014  Falls in the past year? 0 0 0 No No  Number falls in past yr: 0 0 - - -  Injury with Fall? 0 0 - - -  Follow up Falls  prevention discussed Education provided;Falls prevention discussed - - -    FALL RISK PREVENTION PERTAINING TO THE HOME:  Any stairs in or around the home? No  Home free of loose throw rugs in walkways, pet beds, electrical cords, etc? Yes  Adequate lighting in your home to reduce risk of falls? Yes   ASSISTIVE DEVICES UTILIZED TO PREVENT FALLS:  Life alert? No  Use of a cane, walker or w/c? No  Grab bars in the bathroom? No  Shower chair or bench in shower? No  Elevated toilet seat or a handicapped toilet? No   TIMED UP AND GO:  Was the test performed? Yes .  Length of time to ambulate 10 feet: 9 sec.   Gait steady and fast without use of assistive device  Cognitive Function:Normal cognitive status assessed by direct observation by this Nurse Health Advisor. No abnormalities found.          Immunizations Immunization History  Administered Date(s) Administered  . Fluad Quad(high Dose 65+) 05/23/2019  . Influenza Split 07/11/2012, 08/04/2015  . Influenza, High Dose Seasonal PF 08/04/2015  . Influenza,inj,Quad PF,6+ Mos 07/05/2013, 07/25/2014  . Influenza-Unspecified 07/22/2020  . PFIZER(Purple Top)SARS-COV-2 Vaccination 10/15/2019, 11/04/2019, 05/12/2020  . Pneumococcal Conjugate-13 08/18/2014  . Pneumococcal Polysaccharide-23 05/16/2013  . Tdap 02/21/2012   . Zoster 10/04/2007  . Zoster Recombinat (Shingrix) 05/20/2020    TDAP status: Up to date  Flu Vaccine status: Up to date  Pneumococcal vaccine status: Up to date  Covid-19 vaccine status: Completed vaccines  Qualifies for Shingles Vaccine? No   Zostavax completed Yes   Shingrix Completed?: Yes  Screening Tests Health Maintenance  Topic Date Due  . Hepatitis C Screening  Never done  . COLONOSCOPY (Pts 45-70yrs Insurance coverage will need to be confirmed)  09/16/2019  . MAMMOGRAM  11/25/2020  . TETANUS/TDAP  02/20/2022  . INFLUENZA VACCINE  Completed  . DEXA SCAN  Completed  . COVID-19 Vaccine  Completed  . PNA vac Low Risk Adult  Completed  . HPV VACCINES  Aged Out    Health Maintenance  Health Maintenance Due  Topic Date Due  . Hepatitis C Screening  Never done  . COLONOSCOPY (Pts 45-58yrs Insurance coverage will need to be confirmed)  09/16/2019  . MAMMOGRAM  11/25/2020    Colorectal cancer screening: No longer required.   Mammogram status: Scheduled for 01/14/2021  Bone Density status: Completed 04/08/2019. Results reflect: Bone density results: OSTEOPENIA. Repeat every 2 years.  Lung Cancer Screening: (Low Dose CT Chest recommended if Age 65-80 years, 30 pack-year currently smoking OR have quit w/in 15years.) does not qualify.    Additional Screening:  Hepatitis C Screening: does not qualify  Vision Screening: Recommended annual ophthalmology exams for early detection of glaucoma and other disorders of the eye. Is the patient up to date with their annual eye exam?  Yes  Who is the provider or what is the name of the office in which the patient attends annual eye exams? Dr. Kathlen Mody   Dental Screening: Recommended annual dental exams for proper oral hygiene  Community Resource Referral / Chronic Care Management: CRR required this visit?  No   CCM required this visit?  No      Plan:     I have personally reviewed and noted the following in the  patient's chart:   . Medical and social history . Use of alcohol, tobacco or illicit drugs  . Current medications and supplements . Functional ability and status . Nutritional status .  Physical activity . Advanced directives . List of other physicians . Hospitalizations, surgeries, and ER visits in previous 12 months . Vitals . Screenings to include cognitive, depression, and falls . Referrals and appointments  In addition, I have reviewed and discussed with patient certain preventive protocols, quality metrics, and best practice recommendations. A written personalized care plan for preventive services as well as general preventive health recommendations were provided to patient.   Patient would like to access avs on mychart.   Marta Antu, LPN   1/0/3013  Nurse Health Advisor  Nurse Notes: None

## 2020-12-01 NOTE — Patient Instructions (Signed)
Kirsten Sullivan , Thank you for taking time to come for your Medicare Wellness Visit. I appreciate your ongoing commitment to your health goals. Please review the following plan we discussed and let me know if I can assist you in the future.   Screening recommendations/referrals: Colonoscopy: No longer required Mammogram: Scheduled for 01/14/2021 Bone Density: Completed 04/08/2019-Due 04/07/2021 Recommended yearly ophthalmology/optometry visit for glaucoma screening and checkup Recommended yearly dental visit for hygiene and checkup  Vaccinations: Influenza vaccine: Up to date Pneumococcal vaccine: Completed vaccines Tdap vaccine: Up to date-Due 02/20/2022 Shingles vaccine: Completed vaccines  Covid-19:Completed vaccines  Advanced directives: Please bring a copy for your chart  Conditions/risks identified: See problem list  Next appointment: Follow up in one year for your annual wellness visit 12/07/2021 @ 1:30   Preventive Care 65 Years and Older, Female Preventive care refers to lifestyle choices and visits with your health care provider that can promote health and wellness. What does preventive care include?  A yearly physical exam. This is also called an annual well check.  Dental exams once or twice a year.  Routine eye exams. Ask your health care provider how often you should have your eyes checked.  Personal lifestyle choices, including:  Daily care of your teeth and gums.  Regular physical activity.  Eating a healthy diet.  Avoiding tobacco and drug use.  Limiting alcohol use.  Practicing safe sex.  Taking low-dose aspirin every day.  Taking vitamin and mineral supplements as recommended by your health care provider. What happens during an annual well check? The services and screenings done by your health care provider during your annual well check will depend on your age, overall health, lifestyle risk factors, and family history of disease. Counseling  Your  health care provider may ask you questions about your:  Alcohol use.  Tobacco use.  Drug use.  Emotional well-being.  Home and relationship well-being.  Sexual activity.  Eating habits.  History of falls.  Memory and ability to understand (cognition).  Work and work Statistician.  Reproductive health. Screening  You may have the following tests or measurements:  Height, weight, and BMI.  Blood pressure.  Lipid and cholesterol levels. These may be checked every 5 years, or more frequently if you are over 74 years old.  Skin check.  Lung cancer screening. You may have this screening every year starting at age 6 if you have a 30-pack-year history of smoking and currently smoke or have quit within the past 15 years.  Fecal occult blood test (FOBT) of the stool. You may have this test every year starting at age 33.  Flexible sigmoidoscopy or colonoscopy. You may have a sigmoidoscopy every 5 years or a colonoscopy every 10 years starting at age 41.  Hepatitis C blood test.  Hepatitis B blood test.  Sexually transmitted disease (STD) testing.  Diabetes screening. This is done by checking your blood sugar (glucose) after you have not eaten for a while (fasting). You may have this done every 1-3 years.  Bone density scan. This is done to screen for osteoporosis. You may have this done starting at age 42.  Mammogram. This may be done every 1-2 years. Talk to your health care provider about how often you should have regular mammograms. Talk with your health care provider about your test results, treatment options, and if necessary, the need for more tests. Vaccines  Your health care provider may recommend certain vaccines, such as:  Influenza vaccine. This is recommended every year.  Tetanus, diphtheria, and acellular pertussis (Tdap, Td) vaccine. You may need a Td booster every 10 years.  Zoster vaccine. You may need this after age 43.  Pneumococcal 13-valent  conjugate (PCV13) vaccine. One dose is recommended after age 68.  Pneumococcal polysaccharide (PPSV23) vaccine. One dose is recommended after age 17. Talk to your health care provider about which screenings and vaccines you need and how often you need them. This information is not intended to replace advice given to you by your health care provider. Make sure you discuss any questions you have with your health care provider. Document Released: 10/16/2015 Document Revised: 06/08/2016 Document Reviewed: 07/21/2015 Elsevier Interactive Patient Education  2017 [employer] Prevention in the Home Falls can cause injuries. They can happen to people of all ages. There are many things you can do to make your home safe and to help prevent falls. What can I do on the outside of my home?  Regularly fix the edges of walkways and driveways and fix any cracks.  Remove anything that might make you trip as you walk through a door, such as a raised step or threshold.  Trim any bushes or trees on the path to your home.  Use bright outdoor lighting.  Clear any walking paths of anything that might make someone trip, such as rocks or tools.  Regularly check to see if handrails are loose or broken. Make sure that both sides of any steps have handrails.  Any raised decks and porches should have guardrails on the edges.  Have any leaves, snow, or ice cleared regularly.  Use sand or salt on walking paths during winter.  Clean up any spills in your garage right away. This includes oil or grease spills. What can I do in the bathroom?  Use night lights.  Install grab bars by the toilet and in the tub and shower. Do not use towel bars as grab bars.  Use non-skid mats or decals in the tub or shower.  If you need to sit down in the shower, use a plastic, non-slip stool.  Keep the floor dry. Clean up any water that spills on the floor as soon as it happens.  Remove soap buildup in the tub or  shower regularly.  Attach bath mats securely with double-sided non-slip rug tape.  Do not have throw rugs and other things on the floor that can make you trip. What can I do in the bedroom?  Use night lights.  Make sure that you have a light by your bed that is easy to reach.  Do not use any sheets or blankets that are too big for your bed. They should not hang down onto the floor.  Have a firm chair that has side arms. You can use this for support while you get dressed.  Do not have throw rugs and other things on the floor that can make you trip. What can I do in the kitchen?  Clean up any spills right away.  Avoid walking on wet floors.  Keep items that you use a lot in easy-to-reach places.  If you need to reach something above you, use a strong step stool that has a grab bar.  Keep electrical cords out of the way.  Do not use floor polish or wax that makes floors slippery. If you must use wax, use non-skid floor wax.  Do not have throw rugs and other things on the floor that can make you trip. What can I do  with my stairs?  Do not leave any items on the stairs.  Make sure that there are handrails on both sides of the stairs and use them. Fix handrails that are broken or loose. Make sure that handrails are as long as the stairways.  Check any carpeting to make sure that it is firmly attached to the stairs. Fix any carpet that is loose or worn.  Avoid having throw rugs at the top or bottom of the stairs. If you do have throw rugs, attach them to the floor with carpet tape.  Make sure that you have a light switch at the top of the stairs and the bottom of the stairs. If you do not have them, ask someone to add them for you. What else can I do to help prevent falls?  Wear shoes that:  Do not have high heels.  Have rubber bottoms.  Are comfortable and fit you well.  Are closed at the toe. Do not wear sandals.  If you use a stepladder:  Make sure that it is fully  opened. Do not climb a closed stepladder.  Make sure that both sides of the stepladder are locked into place.  Ask someone to hold it for you, if possible.  Clearly mark and make sure that you can see:  Any grab bars or handrails.  First and last steps.  Where the edge of each step is.  Use tools that help you move around (mobility aids) if they are needed. These include:  Canes.  Walkers.  Scooters.  Crutches.  Turn on the lights when you go into a dark area. Replace any light bulbs as soon as they burn out.  Set up your furniture so you have a clear path. Avoid moving your furniture around.  If any of your floors are uneven, fix them.  If there are any pets around you, be aware of where they are.  Review your medicines with your doctor. Some medicines can make you feel dizzy. This can increase your chance of falling. Ask your doctor what other things that you can do to help prevent falls. This information is not intended to replace advice given to you by your health care provider. Make sure you discuss any questions you have with your health care provider. Document Released: 07/16/2009 Document Revised: 02/25/2016 Document Reviewed: 10/24/2014 Elsevier Interactive Patient Education  2017 [employer].

## 2020-12-08 MED FILL — SIMVASTATIN 10 MG TAB: 10 | 90 days supply | Qty: 48 | Fill #1

## 2021-01-14 ENCOUNTER — Ambulatory Visit
Admission: RE | Admit: 2021-01-14 | Discharge: 2021-01-14 | Disposition: A | Discharge status: Home / Self Care | Payer: Medicare Other | Source: Ambulatory Visit | Attending: Family Medicine | Admitting: Family Medicine

## 2021-01-14 ENCOUNTER — Other Ambulatory Visit: Payer: Self-pay

## 2021-01-14 DIAGNOSIS — Z1231 Encounter for screening mammogram for malignant neoplasm of breast: Secondary | ICD-10-CM

## 2021-03-01 MED FILL — Simvastatin Tab 10 MG: ORAL | 90 days supply | Qty: 48 | Fill #0 | Status: AC

## 2021-03-02 ENCOUNTER — Other Ambulatory Visit (HOSPITAL_BASED_OUTPATIENT_CLINIC_OR_DEPARTMENT_OTHER): Payer: Self-pay

## 2021-03-19 ENCOUNTER — Ambulatory Visit: Payer: Medicare Other

## 2021-03-19 NOTE — Progress Notes (Signed)
   Covid-19 Vaccination Clinic  Name:  Nini Cavan    MRN: [id number] DOB: 04-21-1941  03/19/2021  Ms. Hemann was observed post Covid-19 immunization for 15 minutes without incident. She was provided with Vaccine Information Sheet and instruction to access the V-Safe system.   Ms. Fauble was instructed to call 911 with any severe reactions post vaccine: Difficulty breathing  Swelling of face and throat  A fast heartbeat  A bad rash all over body  Dizziness and weakness

## 2021-03-22 ENCOUNTER — Other Ambulatory Visit (HOSPITAL_BASED_OUTPATIENT_CLINIC_OR_DEPARTMENT_OTHER): Payer: Self-pay

## 2021-03-22 MED ORDER — COVID-19 MRNA VAC-TRIS(PFIZER) 30 MCG/0.3ML IM SUSP
INTRAMUSCULAR | 0 refills | Status: DC
  Filled 2021-03-22: qty 0.3, 1d supply, fill #0

## 2021-04-17 ENCOUNTER — Emergency Department (HOSPITAL_BASED_OUTPATIENT_CLINIC_OR_DEPARTMENT_OTHER): Payer: Medicare Other

## 2021-04-17 ENCOUNTER — Other Ambulatory Visit: Payer: Self-pay

## 2021-04-17 ENCOUNTER — Emergency Department (HOSPITAL_BASED_OUTPATIENT_CLINIC_OR_DEPARTMENT_OTHER)
Admission: EM | Admit: 2021-04-17 | Discharge: 2021-04-17 | Disposition: A | Discharge status: Home / Self Care | Payer: Medicare Other | Attending: Emergency Medicine | Admitting: Emergency Medicine

## 2021-04-17 ENCOUNTER — Encounter: Payer: Self-pay | Admitting: Family Medicine

## 2021-04-17 ENCOUNTER — Emergency Department (INDEPENDENT_AMBULATORY_CARE_PROVIDER_SITE_OTHER)
Admission: EM | Admit: 2021-04-17 | Discharge: 2021-04-17 | Disposition: A | Discharge status: Home / Self Care | Payer: Medicare Other | Source: Home / Self Care

## 2021-04-17 ENCOUNTER — Encounter (HOSPITAL_BASED_OUTPATIENT_CLINIC_OR_DEPARTMENT_OTHER): Payer: Self-pay

## 2021-04-17 DIAGNOSIS — S62639B Displaced fracture of distal phalanx of unspecified finger, initial encounter for open fracture: Secondary | ICD-10-CM

## 2021-04-17 DIAGNOSIS — Z23 Encounter for immunization: Secondary | ICD-10-CM | POA: Insufficient documentation

## 2021-04-17 DIAGNOSIS — S62631B Displaced fracture of distal phalanx of left index finger, initial encounter for open fracture: Secondary | ICD-10-CM | POA: Diagnosis not present

## 2021-04-17 DIAGNOSIS — Y92007 Garden or yard of unspecified non-institutional (private) residence as the place of occurrence of the external cause: Secondary | ICD-10-CM | POA: Diagnosis not present

## 2021-04-17 DIAGNOSIS — S61211A Laceration without foreign body of left index finger without damage to nail, initial encounter: Secondary | ICD-10-CM

## 2021-04-17 DIAGNOSIS — Y9389 Activity, other specified: Secondary | ICD-10-CM | POA: Insufficient documentation

## 2021-04-17 DIAGNOSIS — W293XXA Contact with powered garden and outdoor hand tools and machinery, initial encounter: Secondary | ICD-10-CM | POA: Insufficient documentation

## 2021-04-17 DIAGNOSIS — S6992XA Unspecified injury of left wrist, hand and finger(s), initial encounter: Secondary | ICD-10-CM | POA: Diagnosis present

## 2021-04-17 MED ORDER — CEPHALEXIN 500 MG PO CAPS: 500.0000 mg | ORAL_CAPSULE | Freq: Four times a day (QID) | ORAL | 0 refills | Status: DC

## 2021-04-17 MED ORDER — TETANUS-DIPHTH-ACELL PERTUSSIS 5-2.5-18.5 LF-MCG/0.5 IM SUSY
0.5000 mL | PREFILLED_SYRINGE | Freq: Once | INTRAMUSCULAR | Status: AC
  Administered 2021-04-17: 0.5 mL via INTRAMUSCULAR
  Filled 2021-04-17: qty 0.5

## 2021-04-17 MED ORDER — CEFAZOLIN SODIUM 1 G IJ SOLR
1.0000 g | Freq: Once | INTRAMUSCULAR | Status: AC
  Administered 2021-04-17: 1 g via INTRAMUSCULAR
  Filled 2021-04-17: qty 10

## 2021-04-17 MED ORDER — LIDOCAINE HCL (PF) 1 % IJ SOLN
10.0000 mL | Freq: Once | INTRAMUSCULAR | Status: AC
  Administered 2021-04-17: 10 mL
  Filled 2021-04-17: qty 10

## 2021-04-17 NOTE — Discharge Instructions (Addendum)
Advised/instructed patient to go to [facility] ED now for further evaluation, laceration repair, and xray of left index finger.

## 2021-04-17 NOTE — Discharge Instructions (Addendum)
Keep finger clean and dry with splint in place until you follow-up with the hand specialist.  Please call to schedule follow-up appointment with Dr. Caralyn Guile.  Take Keflex 4 times daily to help prevent infection.  Use ibuprofen and Tylenol as needed for pain as well as ice and elevation to help with pain and swelling.  If you develop worsening redness, pain, swelling, or purulent drainage please return for reevaluation.

## 2021-04-17 NOTE — ED Notes (Signed)
Patient transported to X-ray 

## 2021-04-17 NOTE — ED Provider Notes (Signed)
[facility] DEPART[facility] Provider Note   CSN: [id number] Arrival date & time: 04/17/21  1504     History Chief Complaint  Patient presents with   Finger Injury    Kirsten Sullivan is a 80 y.o. female.  Kirsten Sullivan is a 80 y.o. female with history of anemia, and hyperlipidemia, who presents to the emergency department for evaluation of laceration to her left index finger..  Patient reports earlier today she was out working in her yard with a new pair of garden shears and accidentally cut the tip of her left index finger.  Was initially bleeding but bleeding has been controlled with dressing.  Initially went to urgent care in [place] but was sent to the emergency department for further evaluation and laceration repair.  Patient is able to move finger without difficulty.  Denies numbness or tingling.  She has not on blood thinners.  Is unsure of her last tetanus vaccination.  The history is provided by the patient and medical records.      Past Medical History:  Diagnosis Date   Adenomatous polyps    colonoscopy 07/07/2011 Dr. Marcelle Sm[place], [place]   Anemia    Fibrocystic breast    Hyperlipidemia     Patient Active Problem List   Diagnosis Date Noted   H/O bone density study 05/16/2013   H/O hysterectomy for benign disease 03/11/2012   Anemia    Fibrocystic breast    Hyperlipidemia    Adenomatous polyps     Past Surgical History:  Procedure Laterality Date   ABDOMINAL HYSTERECTOMY     BREAST BIOPSY     right- benign   COLONOSCOPY     POLYPECTOMY       OB History     Gravida  0   Para  0   Term  0   Preterm  0   AB  0   Living  0      SAB  0   IAB  0   Ectopic  0   Multiple  0   Live Births              Family History  Problem Relation Age of Onset   Alzheimer's disease Mother    Prostate cancer Father    Macular degeneration Father    Hypertension Father    Gout Maternal Grandmother    Heart  disease Maternal Grandfather 65   Colitis Sister    Colon cancer Neg Hx    Colon polyps Neg Hx    Rectal cancer Neg Hx    Stomach cancer Neg Hx    Breast cancer Neg Hx     Social History   Tobacco Use   Smoking status: Never   Smokeless tobacco: Never  Vaping Use   Vaping Use: Never used  Substance Use Topics   Alcohol use: No   Drug use: No    Home Medications Prior to Admission medications   Medication Sig Start Date End Date Taking? Authorizing Provider  cephALEXin (KEFLEX) 500 MG capsule Take 1 capsule (500 mg total) by mouth 4 (four) times daily. 04/17/21  Yes Jacqlyn Larsen, PA-C  CALCIUM CARB-CHOLECALCIFEROL PO Take by mouth. 500mg -58mcg    [provider]  COVID-19 mRNA Vac-TriS, Pfizer, SUSP injection Inject into the muscle. 03/19/21   Carlyle Basques, MD  Cyanocobalamin (VITAMIN B-12 CR PO) Take 5,000 mcg by mouth daily.     [provider]  Multiple Vitamins-Minerals (CENTRUM SILVER PO) Take  1 tablet by mouth daily.    [provider]  Omega-3 Fatty Acids (FISH OIL TRIPLE STRENGTH) 1400 MG CAPS Take 1,200 mg by mouth daily.     [provider]  simvastatin (ZOCOR) 10 MG tablet TAKE 1 TABLET (10 MG TOTAL) BY MOUTH AT BEDTIME. TAKE ONE TABLET MON, WEDS, FRIDAY AND SATURDAY 09/14/20 09/14/21  Cirigliano, Garvin Fila, DO  vitamin C (ASCORBIC ACID) 500 MG tablet Take 500 mg by mouth daily.    [provider]  VITAMIN E PO Take 800 mg by mouth daily.    [provider]    Allergies    Patient has no known allergies.  Review of Systems   Review of Systems  Constitutional:  Negative for chills and fever.  Skin:  Positive for wound.  Neurological:  Negative for weakness and numbness.  All other systems reviewed and are negative.  Physical Exam Updated Vital Signs BP 138/67 (BP Location: Right Arm)   Pulse 68   Temp 98.2 F (36.8 C) (Oral)   Resp 16   Ht 5\' 3"  (1.6 m)   Wt 63 kg   SpO2 100%   BMI 24.60 kg/m    Physical Exam Vitals and nursing note reviewed.  Constitutional:      General: She is not in acute distress.    Appearance: Normal appearance. She is well-developed. She is not ill-appearing or diaphoretic.  HENT:     Head: Normocephalic and atraumatic.  Eyes:     General:        Right eye: No discharge.        Left eye: No discharge.  Pulmonary:     Effort: Pulmonary effort is normal. No respiratory distress.  Musculoskeletal:     Comments: Laceration to the left index finger as pictured below.  Linear laceration extends from the medial aspect of the finger over the t tip of the finger and down the lateral aspect to just above the DIP joint.  No damage to the nail or nailbed.  Bleeding is controlled.  The finger pad is somewhat dusky but suspect this is more from swelling and hematoma, sensation intact, able to flex and extend the finger.  Neurological:     Mental Status: She is alert and oriented to person, place, and time.     Coordination: Coordination normal.  Psychiatric:        Mood and Affect: Mood normal.        Behavior: Behavior normal.        ED Results / Procedures / Treatments   Labs (all labs ordered are listed, but only abnormal results are displayed) Labs Reviewed - No data to display  EKG None  Radiology DG Finger Index Left  Result Date: 04/17/2021 CLINICAL DATA:  Laceration. EXAM: LEFT INDEX FINGER 2+V COMPARISON:  None. FINDINGS: There is a longitudinal fracture of the distal left second phalanx with palmar displacement of the smaller fracture fragment. Associated soft tissue injury and hematoma. IMPRESSION: Displaced longitudinal fracture of the distal left second phalanx. Electronically Signed   By: Fidela Salisbury M.D.   On: 04/17/2021 16:15    Procedures .Marland KitchenLaceration Repair  Date/Time: 04/18/2021 12:47 PM Performed by: Jacqlyn Larsen, PA-C Authorized by: Jacqlyn Larsen, PA-C   Consent:    Consent obtained:  Verbal   Consent given by:   Patient   Risks discussed:  Infection, pain, need for additional repair, poor cosmetic result, poor wound healing, nerve damage and vascular damage   Alternatives  discussed:  No treatment Universal protocol:    Procedure explained and questions answered to patient or proxy's satisfaction: yes     Patient identity confirmed:  Verbally with patient Anesthesia:    Anesthesia method:  Nerve block   Block needle gauge:  25 G   Block anesthetic:  Lidocaine 1% w/o epi   Block injection procedure:  Anatomic landmarks identified, introduced needle, incremental injection and negative aspiration for blood   Block outcome:  Anesthesia achieved Laceration details:    Location:  Finger   Finger location:  L index finger   Length (cm):  4.5 Pre-procedure details:    Preparation:  Patient was prepped and draped in usual sterile fashion and imaging obtained to evaluate for foreign bodies Exploration:    Hemostasis achieved with:  Tourniquet   Imaging outcome: foreign body not noted     Wound exploration: wound explored through full range of motion and entire depth of wound visualized     Wound extent: areolar tissue violated and underlying fracture     Wound extent: no tendon damage noted   Treatment:    Area cleansed with:  Saline   Amount of cleaning:  Extensive   Irrigation solution:  Sterile saline   Irrigation volume:  500 ml   Irrigation method:  Pressure wash Skin repair:    Repair method:  Sutures   Suture size:  5-0   Suture material:  Nylon   Suture technique:  Simple interrupted   Number of sutures:  8 Approximation:    Approximation:  Close Repair type:    Repair type:  Simple Post-procedure details:    Dressing:  Splint for protection and non-adherent dressing   Procedure completion:  Tolerated well, no immediate complications Comments:     Laceration repair completed by PA student under my direct supervision.   Medications Ordered in ED Medications  Tdap (BOOSTRIX)  injection 0.5 mL (0.5 mLs Intramuscular Given 04/17/21 1552)  lidocaine (PF) (XYLOCAINE) 1 % injection 10 mL (10 mLs Infiltration Given 04/17/21 1658)  ceFAZolin (ANCEF) injection 1 g (1 g Intramuscular Given 04/17/21 1659)    ED Course  I have reviewed the triage vital signs and the nursing notes.  Pertinent labs & imaging results that were available during my care of the patient were reviewed by me and considered in my medical decision making (see chart for details).    MDM Rules/Calculators/A&P                         80 year old female presents to the emergency department for laceration to the tip of the left index finger, laceration extends from the medial aspect of the finger across the tip of the finger and down the lateral aspect to about the DIP joint.  Concern for underlying fracture.  Finger is neurovascularly intact.  Does have some dusky discoloration to the digit pad but suspect this is from swelling and hematoma.  Tetanus updated.  X-ray shows a longitudinal fracture of the distal phalanx.  Patient given Ancef here in the ED and will be treated with Keflex for open fracture, but will go ahead and approximate wound and have patient follow-up as an outpatient with Dr. Caralyn Guile with hand surgery.  Laceration anesthetized with digital block, copiously irrigated and closed with simple interrupted sutures.  Patient tolerated procedure well, dressing and splint applied.  Prescribed Keflex for open fracture and discharged home instructed to call Dr. Maxie Barb office first thing Monday morning for  close follow-up.  Final Clinical Impression(s) / ED Diagnoses Final diagnoses:  Open fracture of tuft of distal phalanx of finger  Laceration of left index finger without foreign body without damage to nail, initial encounter    Rx / DC Orders ED Discharge Orders          Ordered    cephALEXin (KEFLEX) 500 MG capsule  4 times daily        04/17/21 1734             Jacqlyn Larsen,  [place] 04/18/21 1252    Drenda Freeze, MD 04/18/21 [phone]

## 2021-04-17 NOTE — ED Triage Notes (Signed)
Pt with laceartion to L index finger from electric scissors while working in the yard.  Pt arrived with bandage in place and bleeding is controlled at triage.

## 2021-04-17 NOTE — ED Provider Notes (Signed)
[facility]    CSN: [id number] Arrival date & time: 04/17/21  1341      History   Chief Complaint Chief Complaint  Patient presents with   Laceration    L index    HPI Kirsten Sullivan is a 81 y.o. female.   HPI Very pleasant 80 year old female presents with laceration to left index finger sustained at 1045 this morning.  Patient reports cutting her self accidentally with electric scissors while working in her yard at 10:45 AM.  Reports bleeding is now controlled and is unsure of last tetanus shot.  Past Medical History:  Diagnosis Date   Adenomatous polyps    colonoscopy 07/07/2011 Dr. Marcelle Smiling, [place]   Anemia    Fibrocystic breast    Hyperlipidemia     Patient Active Problem List   Diagnosis Date Noted   H/O bone density study 05/16/2013   H/O hysterectomy for benign disease 03/11/2012   Anemia    Fibrocystic breast    Hyperlipidemia    Adenomatous polyps     Past Surgical History:  Procedure Laterality Date   ABDOMINAL HYSTERECTOMY     BREAST BIOPSY     right- benign   COLONOSCOPY     POLYPECTOMY      OB History     Gravida  0   Para  0   Term  0   Preterm  0   AB  0   Living  0      SAB  0   IAB  0   Ectopic  0   Multiple  0   Live Births               Home Medications    Prior to Admission medications   Medication Sig Start Date End Date Taking? Authorizing Provider  CALCIUM CARB-CHOLECALCIFEROL PO Take by mouth. 500mg -82mcg    [provider]  COVID-19 mRNA Vac-TriS, Pfizer, SUSP injection Inject into the muscle. 03/19/21   Carlyle Basques, MD  Cyanocobalamin (VITAMIN B-12 CR PO) Take 5,000 mcg by mouth daily.     [provider]  Multiple Vitamins-Minerals (CENTRUM SILVER PO) Take 1 tablet by mouth daily.    [provider]  Omega-3 Fatty Acids (FISH OIL TRIPLE STRENGTH) 1400 MG CAPS Take 1,200 mg by mouth daily.     [provider]  simvastatin (ZOCOR) 10 MG  tablet TAKE 1 TABLET (10 MG TOTAL) BY MOUTH AT BEDTIME. TAKE ONE TABLET MON, WEDS, FRIDAY AND SATURDAY 09/14/20 09/14/21  Cirigliano, Garvin Fila, DO  vitamin C (ASCORBIC ACID) 500 MG tablet Take 500 mg by mouth daily.    [provider]  VITAMIN E PO Take 800 mg by mouth daily.    [provider]    Family History Family History  Problem Relation Age of Onset   Alzheimer's disease Mother    Prostate cancer Father    Macular degeneration Father    Hypertension Father    Gout Maternal Grandmother    Heart disease Maternal Grandfather 50   Colitis Sister    Colon cancer Neg Hx    Colon polyps Neg Hx    Rectal cancer Neg Hx    Stomach cancer Neg Hx    Breast cancer Neg Hx     Social History Social History   Tobacco Use   Smoking status: Never   Smokeless tobacco: Never  Vaping Use   Vaping Use: Never used  Substance Use Topics   Alcohol use:  No   Drug use: No     Allergies   Patient has no known allergies.   Review of Systems Review of Systems  Skin:  Positive for wound.    Physical Exam Triage Vital Signs ED Triage Vitals  Enc Vitals Group     BP 04/17/21 1418 (!) 145/77     Pulse Rate 04/17/21 1418 65     Resp 04/17/21 1418 20     Temp 04/17/21 1418 98.4 F (36.9 C)     Temp Source 04/17/21 1418 Oral     SpO2 04/17/21 1418 98 %     Weight 04/17/21 1414 139 lb (63 kg)     Height 04/17/21 1414 5\' 3"  (1.6 m)     Head Circumference --      Peak Flow --      Pain Score 04/17/21 1413 2     Pain Loc --      Pain Edu? --      Excl. in [place]? --    No data found.  Updated Vital Signs BP (!) 145/77 (BP Location: Right Arm)   Pulse 65   Temp 98.4 F (36.9 C) (Oral)   Resp 20   Ht 5\' 3"  (1.6 m)   Wt 139 lb (63 kg)   SpO2 98%   BMI 24.62 kg/m      Physical Exam Vitals and nursing note reviewed.  Constitutional:      Appearance: Normal appearance. She is normal weight.  HENT:     Head: Normocephalic and atraumatic.     Mouth/Throat:      Mouth: Mucous membranes are moist.     Pharynx: Oropharynx is clear.  Eyes:     Extraocular Movements: Extraocular movements intact.     Conjunctiva/sclera: Conjunctivae normal.     Pupils: Pupils are equal, round, and reactive to light.  Cardiovascular:     Rate and Rhythm: Normal rate and regular rhythm.     Pulses: Normal pulses.     Heart sounds: Normal heart sounds.  Pulmonary:     Effort: Pulmonary effort is normal.     Breath sounds: Normal breath sounds.  Musculoskeletal:        General: Normal range of motion.     Cervical back: Normal range of motion and neck supple.  Skin:    General: Skin is warm and dry.     Comments: Left index finger: ~4.5-5.0 cm linear laceration from medial aspect of DIP around  tip of DIP adjacent to nail plate to lateral aspect of proximal PIP, hemostasis achieved, neurovascular/neurosensory intact, with brisk cap refill.  Neurological:     General: No focal deficit present.     Mental Status: She is alert and oriented to person, place, and time.  Psychiatric:        Mood and Affect: Mood normal.        Behavior: Behavior normal.     UC Treatments / Results  Labs (all labs ordered are listed, but only abnormal results are displayed) Labs Reviewed - No data to display  EKG   Radiology DG Finger Index Left  Result Date: 04/17/2021 CLINICAL DATA:  Laceration. EXAM: LEFT INDEX FINGER 2+V COMPARISON:  None. FINDINGS: There is a longitudinal fracture of the distal left second phalanx with palmar displacement of the smaller fracture fragment. Associated soft tissue injury and hematoma. IMPRESSION: Displaced longitudinal fracture of the distal left second phalanx. Electronically Signed   By: Fidela Salisbury M.D.   On: 04/17/2021  16:15    Procedures Procedures (including critical care time)  Medications Ordered in UC Medications - No data to display  Initial Impression / Assessment and Plan / UC Course  I have reviewed the triage vital  signs and the nursing notes.  Pertinent labs & imaging results that were available during my care of the patient were reviewed by me and considered in my medical decision making (see chart for details).    MDM: Left finger index laceration-advised patient to go to nearest ED [facility] for laceration repair with xray of left index finger.  Patient discharged home, hemodynamically stable Final Clinical Impressions(s) / UC Diagnoses   Final diagnoses:  Laceration of left index finger without foreign body without damage to nail, initial encounter     Discharge Instructions      Advised/instructed patient to go to [facility] ED now for further evaluation and laceration repair.     ED Prescriptions   None    PDMP not reviewed this encounter.   Eliezer Lofts, [place] 04/17/21 1635

## 2021-04-17 NOTE — ED Notes (Signed)
Patient is being discharged from the Urgent Care and sent to the Emergency Department via personal vehicle . Per M. Ragan, FNP, patient is in need of higher level of care due to requiring complex laceration repair. Patient is aware and verbalizes understanding of plan of care.  Vitals:   04/17/21 1418  BP: (!) 145/77  Pulse: 65  Resp: 20  Temp: 98.4 F (36.9 C)  SpO2: 98%

## 2021-04-17 NOTE — ED Triage Notes (Addendum)
Pt presents to Urgent Care with laceration to L index finger. Reports she cut herself with "electric scissors" while working in the yard this morning at 1045. Bleeding is now controlled. Unknown date of last Tetanus shot.

## 2021-05-19 ENCOUNTER — Other Ambulatory Visit (HOSPITAL_BASED_OUTPATIENT_CLINIC_OR_DEPARTMENT_OTHER): Payer: Self-pay

## 2021-05-19 MED FILL — Simvastatin Tab 10 MG: ORAL | 90 days supply | Qty: 48 | Fill #1 | Status: AC

## 2021-05-20 ENCOUNTER — Ambulatory Visit: Payer: Medicare Other | Admitting: Family Medicine

## 2021-05-28 ENCOUNTER — Other Ambulatory Visit (HOSPITAL_BASED_OUTPATIENT_CLINIC_OR_DEPARTMENT_OTHER): Payer: Self-pay

## 2021-05-28 ENCOUNTER — Other Ambulatory Visit: Payer: Self-pay

## 2021-05-28 ENCOUNTER — Ambulatory Visit: Payer: Medicare Other | Admitting: Family Medicine

## 2021-05-28 ENCOUNTER — Encounter: Payer: Self-pay | Admitting: Family Medicine

## 2021-05-28 VITALS — BP 122/66 | HR 57 | Temp 97.0°F | Ht 63.0 in | Wt 137.2 lb

## 2021-05-28 DIAGNOSIS — Z0000 Encounter for general adult medical examination without abnormal findings: Secondary | ICD-10-CM

## 2021-05-28 DIAGNOSIS — M8580 Other specified disorders of bone density and structure, unspecified site: Secondary | ICD-10-CM | POA: Diagnosis not present

## 2021-05-28 DIAGNOSIS — E782 Mixed hyperlipidemia: Secondary | ICD-10-CM

## 2021-05-28 LAB — URINALYSIS, ROUTINE W REFLEX MICROSCOPIC
Bilirubin Urine: NEGATIVE
Hgb urine dipstick: NEGATIVE
Ketones, ur: NEGATIVE
Nitrite: NEGATIVE
Specific Gravity, Urine: 1.02 (ref 1.000–1.030)
Total Protein, Urine: NEGATIVE
Urine Glucose: NEGATIVE
Urobilinogen, UA: 0.2 (ref 0.0–1.0)
pH: 6 (ref 5.0–8.0)

## 2021-05-28 LAB — COMPREHENSIVE METABOLIC PANEL
ALT: 18 U/L (ref 0–35)
AST: 21 U/L (ref 0–37)
Albumin: 4.2 g/dL (ref 3.5–5.2)
Alkaline Phosphatase: 37 U/L — ABNORMAL LOW (ref 39–117)
BUN: 22 mg/dL (ref 6–23)
CO2: 28 mEq/L (ref 19–32)
Calcium: 9.3 mg/dL (ref 8.4–10.5)
Chloride: 103 mEq/L (ref 96–112)
Creatinine, Ser: 0.79 mg/dL (ref 0.40–1.20)
GFR: 71.6 mL/min (ref >60.00)
Glucose, Bld: 84 mg/dL (ref 70–99)
Potassium: 4.4 mEq/L (ref 3.5–5.1)
Sodium: 139 mEq/L (ref 135–145)
Total Bilirubin: 0.5 mg/dL (ref 0.2–1.2)
Total Protein: 6.6 g/dL (ref 6.0–8.3)

## 2021-05-28 LAB — CBC
HCT: 34.3 % — ABNORMAL LOW (ref 36.0–46.0)
Hemoglobin: 11.6 g/dL — ABNORMAL LOW (ref 12.0–15.0)
MCHC: 33.8 g/dL (ref 30.0–36.0)
MCV: 92.5 fl (ref 78.0–100.0)
Platelets: 269 10*3/uL (ref 150.0–400.0)
RBC: 3.71 Mil/uL — ABNORMAL LOW (ref 3.87–5.11)
RDW: 13.8 % (ref 11.5–15.5)
WBC: 5.5 10*3/uL (ref 4.0–10.5)

## 2021-05-28 LAB — LIPID PANEL
Cholesterol: 188 mg/dL (ref 0–200)
HDL: 77.3 mg/dL (ref >39.00)
LDL Cholesterol: 100 mg/dL — ABNORMAL HIGH (ref 0–99)
NonHDL: 110.41
Total CHOL/HDL Ratio: 2
Triglycerides: 53 mg/dL (ref 0.0–149.0)
VLDL: 10.6 mg/dL (ref 0.0–40.0)

## 2021-05-28 MED ORDER — SIMVASTATIN 10 MG PO TABS
ORAL_TABLET | ORAL | 3 refills | Status: DC
  Filled 2021-05-28: qty 48, fill #0
  Filled 2021-08-10: qty 48, 84d supply, fill #0
  Filled 2021-11-05: qty 48, 84d supply, fill #1
  Filled 2022-01-29: qty 48, 84d supply, fill #2
  Filled 2022-04-21: qty 48, 84d supply, fill #3

## 2021-05-28 NOTE — Progress Notes (Signed)
Established Patient Office Visit  Subjective:  Patient ID: Kirsten Sullivan, female    DOB: 1941/05/15  Age: 80 y.o. MRN: [id number]  CC:  Chief Complaint  Patient presents with   Transitions Of Care    TOC from Dr. Loletha Grayer 6 month follow up on labs, patient fasting.     HPI Kirsten Sullivan presents for follow-up of elevated cholesterol.  She has taken simvastatin for years without issue.  She is up-to-date on all of her immunizations.  She lives alone but has family close by.  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  She exercises by walking.  Continues calcium and 800 international units of vitamin D daily for osteopenia from bone scan in 2020.  Past Medical History:  Diagnosis Date   Adenomatous polyps    colonoscopy 07/07/2011 Dr. Marcelle Smiling[place]   Anemia    Fibrocystic breast    Hyperlipidemia     Past Surgical History:  Procedure Laterality Date   ABDOMINAL HYSTERECTOMY     BREAST BIOPSY     right- benign   COLONOSCOPY     POLYPECTOMY      Family History  Problem Relation Age of Onset   Alzheimer's disease Mother    Prostate cancer Father    Macular degeneration Father    Hypertension Father    Gout Maternal Grandmother    Heart disease Maternal Grandfather 24   Colitis Sister    Colon cancer Neg Hx    Colon polyps Neg Hx    Rectal cancer Neg Hx    Stomach cancer Neg Hx    Breast cancer Neg Hx     Social History   Socioeconomic History   Marital status: Single    Spouse name: Not on file   Number of children: Not on file   Years of education: Not on file   Highest education level: Not on file  Occupational History   Occupation: Retired  Tobacco Use   Smoking status: Never   Smokeless tobacco: Never  Vaping Use   Vaping Use: Never used  Substance and Sexual Activity   Alcohol use: No   Drug use: No   Sexual activity: Not Currently  Other Topics Concern   Not  on file  Social History Narrative   Not on file   Social Determinants of Health   Financial Resource Strain: Low Risk    Difficulty of Paying Living Expenses: Not hard at all  Food Insecurity: No Food Insecurity   Worried About Charity fundraiser in the Last Year: Never true   Ran Out of Food in the Last Year: Never true  Transportation Needs: No Transportation Needs   Lack of Transportation (Medical): No   Lack of Transportation (Non-Medical): No  Physical Activity: Insufficiently Active   Days of Exercise per Week: 1 day   Minutes of Exercise per Session: 30 min  Stress: No Stress Concern Present   Feeling of Stress : Not at all  Social Connections: Moderately Integrated   Frequency of Communication with Friends and Family: More than three times a week   Frequency of Social Gatherings with Friends and Family: More than three times a week   Attends Religious Services: More than 4 times per year   Active Member of [employer] or Organizations: Yes   Attends Music therapist: More than 4 times per year   Marital Status: Never married  Intimate  Partner Violence: Not At Risk   Fear of Current or Ex-Partner: No   Emotionally Abused: No   Physically Abused: No   Sexually Abused: No    Outpatient Medications Prior to Visit  Medication Sig Dispense Refill   CALCIUM CARB-CHOLECALCIFEROL PO Take by mouth. '500mg'$ -23mg     Cyanocobalamin (VITAMIN B-12 CR PO) Take 5,000 mcg by mouth daily.      Multiple Vitamins-Minerals (CENTRUM SILVER PO) Take 1 tablet by mouth daily.     Omega-3 Fatty Acids (FISH OIL TRIPLE STRENGTH) 1400 MG CAPS Take 1,200 mg by mouth daily.      vitamin C (ASCORBIC ACID) 500 MG tablet Take 500 mg by mouth daily.     VITAMIN E PO Take 800 mg by mouth daily.     simvastatin (ZOCOR) 10 MG tablet TAKE 1 TABLET (10 MG TOTAL) BY MOUTH AT BEDTIME. TAKE ONE TABLET MON, WEDS, FRIDAY AND SATURDAY 48 tablet 3   cephALEXin (KEFLEX) 500 MG capsule Take 1 capsule (500 mg  total) by mouth 4 (four) times daily. 28 capsule 0   COVID-19 mRNA Vac-TriS, Pfizer, SUSP injection Inject into the muscle. 0.3 mL 0   Facility-Administered Medications Prior to Visit  Medication Dose Route Frequency Provider Last Rate Last Admin   0.9 %  sodium chloride infusion  500 mL Intravenous Continuous [name] MD        No Known Allergies  ROS Review of Systems  Constitutional: Negative.   Respiratory: Negative.    Cardiovascular: Negative.   Gastrointestinal: Negative.   Genitourinary: Negative.      Objective:    Physical Exam Vitals and nursing note reviewed.  Constitutional:      General: She is not in acute distress.    Appearance: Normal appearance. She is normal weight. She is not ill-appearing, toxic-appearing or diaphoretic.  HENT:     Head: Normocephalic and atraumatic.     Right Ear: Tympanic membrane, ear canal and external ear normal.     Left Ear: Tympanic membrane, ear canal and external ear normal.     Mouth/Throat:     Mouth: Mucous membranes are moist.     Pharynx: Oropharynx is clear. No oropharyngeal exudate or posterior oropharyngeal erythema.  Eyes:     General: No scleral icterus.       Right eye: No discharge.        Left eye: No discharge.     Extraocular Movements: Extraocular movements intact.     Conjunctiva/sclera: Conjunctivae normal.     Pupils: Pupils are equal, round, and reactive to light.  Neck:     Vascular: No carotid bruit.  Cardiovascular:     Rate and Rhythm: Normal rate and regular rhythm.  Pulmonary:     Effort: Pulmonary effort is normal.     Breath sounds: Normal breath sounds.  Abdominal:     General: Bowel sounds are normal.  Musculoskeletal:     Cervical back: No rigidity or tenderness.     Right lower leg: No edema.     Left lower leg: No edema.  Lymphadenopathy:     Cervical: No cervical adenopathy.  Skin:    General: Skin is warm and dry.  Neurological:     Mental Status: She is alert and  oriented to person, place, and time.  Psychiatric:        Mood and Affect: Mood normal.        Behavior: Behavior normal.    BP 122/66 (BP Location: Right  Arm, Patient Position: Sitting, Cuff Size: Normal)   Pulse (!) 57   Temp (!) 97 F (36.1 C) (Temporal)   Ht '5\' 3"'$  (1.6 m)   Wt 137 lb 3.2 oz (62.2 kg)   SpO2 98%   BMI 24.30 kg/m  Wt Readings from Last 3 Encounters:  05/28/21 137 lb 3.2 oz (62.2 kg)  04/17/21 138 lb 14.2 oz (63 kg)  04/17/21 139 lb (63 kg)     Health Maintenance Due  Topic Date Due   Hepatitis C Screening  Never done   COLONOSCOPY (Pts 45-54yr Insurance coverage will need to be confirmed)  09/16/2019   Zoster Vaccines- Shingrix (2 of 2) 07/15/2020   INFLUENZA VACCINE  05/03/2021    There are no preventive care reminders to display for this patient.  Lab Results  Component Value Date   TSH 1.617 07/25/2014   Lab Results  Component Value Date   WBC 4.9 12/02/2015   HGB 12.0 12/02/2015   HCT 36.2 12/02/2015   MCV 90.8 12/02/2015   PLT 307.0 12/02/2015   Lab Results  Component Value Date   NA 140 11/20/2020   K 3.9 11/20/2020   CO2 32 11/20/2020   GLUCOSE 90 11/20/2020   BUN 15 11/20/2020   CREATININE 0.82 11/20/2020   BILITOT 0.5 11/20/2020   ALKPHOS 40 11/20/2020   AST 16 11/20/2020   ALT 14 11/20/2020   PROT 6.5 11/20/2020   ALBUMIN 4.2 11/20/2020   CALCIUM 9.1 11/20/2020   GFR 68.71 11/20/2020   Lab Results  Component Value Date   CHOL 194 11/20/2020   Lab Results  Component Value Date   HDL 80.30 11/20/2020   Lab Results  Component Value Date   LDLCALC 101 (H) 11/20/2020   Lab Results  Component Value Date   TRIG 68.0 11/20/2020   Lab Results  Component Value Date   CHOLHDL 2 11/20/2020   Lab Results  Component Value Date   HGBA1C 5.8 11/20/2020      Assessment & Plan:   Problem List Items Addressed This Visit       Other   Hyperlipidemia   Relevant Medications   simvastatin (ZOCOR) 10 MG tablet    Other Relevant Orders   Lipid panel   Comprehensive metabolic panel   Other Visit Diagnoses     Healthcare maintenance    -  Primary   Relevant Orders   Urinalysis, Routine w reflex microscopic   CBC   Osteopenia, unspecified location           Meds ordered this encounter  Medications   simvastatin (ZOCOR) 10 MG tablet    Sig: TAKE 1 TABLET (10 MG TOTAL) BY MOUTH AT BEDTIME. TAKE ONE TABLET MON, WEDS, FRIDAY AND SATURDAY    Dispense:  48 tablet    Refill:  3    Follow-up: Return in about 6 months (around 11/28/2021).  Given information on health maintenance and disease prevention.  Encouraged her to continue her healthy lifestyle with exercise and social interaction.  Continue Zocor.  Favorable HDL level.  Up-to-date on health maintenance.  WLibby Maw MD

## 2021-07-20 ENCOUNTER — Other Ambulatory Visit (HOSPITAL_BASED_OUTPATIENT_CLINIC_OR_DEPARTMENT_OTHER): Payer: Self-pay

## 2021-07-20 ENCOUNTER — Ambulatory Visit: Payer: Medicare Other | Attending: Internal Medicine

## 2021-07-20 DIAGNOSIS — Z23 Encounter for immunization: Secondary | ICD-10-CM

## 2021-07-20 MED ORDER — INFLUENZA VAC A&B SA ADJ QUAD 0.5 ML IM PRSY
PREFILLED_SYRINGE | INTRAMUSCULAR | 0 refills | Status: DC
  Filled 2021-07-20: qty 0.5, 1d supply, fill #0

## 2021-07-20 NOTE — Progress Notes (Signed)
   Covid-19 Vaccination Clinic  Name:  Kirsten Sullivan    MRN: [id number] DOB: August 22, 1941  07/20/2021  [name] was observed post Covid-19 immunization for 15 minutes without incident. She was provided with Vaccine Information Sheet and instruction to access the V-Safe system.   [name] was instructed to call 911 with any severe reactions post vaccine: Difficulty breathing  Swelling of face and throat  A fast heartbeat  A bad rash all over body  Dizziness and weakness

## 2021-08-03 ENCOUNTER — Telehealth: Payer: Self-pay | Admitting: Family Medicine

## 2021-08-03 NOTE — Telephone Encounter (Signed)
Pt came in office regarding bill for DOS 05/28/2021 - Sent msg to coding manager for further info - KO 08/03/21

## 2021-08-05 NOTE — Telephone Encounter (Signed)
Code was incorrect. Dawn sent msg to charge correction and this has been completed. I called pt and advised to allow 30-45 days for resubmission.

## 2021-08-11 ENCOUNTER — Other Ambulatory Visit (HOSPITAL_BASED_OUTPATIENT_CLINIC_OR_DEPARTMENT_OTHER): Payer: Self-pay

## 2021-08-17 ENCOUNTER — Other Ambulatory Visit (HOSPITAL_BASED_OUTPATIENT_CLINIC_OR_DEPARTMENT_OTHER): Payer: Self-pay

## 2021-08-17 MED ORDER — PFIZER COVID-19 VAC BIVALENT 30 MCG/0.3ML IM SUSP
INTRAMUSCULAR | 0 refills | Status: DC
Start: 2021-07-20 — End: 2021-11-12
  Filled 2021-08-17: qty 0.3, 1d supply, fill #0

## 2021-11-05 ENCOUNTER — Other Ambulatory Visit (HOSPITAL_BASED_OUTPATIENT_CLINIC_OR_DEPARTMENT_OTHER): Payer: Self-pay

## 2021-11-12 ENCOUNTER — Encounter: Payer: Self-pay | Admitting: Family Medicine

## 2021-11-12 ENCOUNTER — Ambulatory Visit: Payer: Medicare Other | Admitting: Family Medicine

## 2021-11-12 VITALS — BP 122/70 | HR 64 | Temp 98.0°F | Resp 17 | Ht 63.0 in | Wt 139.0 lb

## 2021-11-12 DIAGNOSIS — E782 Mixed hyperlipidemia: Secondary | ICD-10-CM

## 2021-11-12 DIAGNOSIS — D649 Anemia, unspecified: Secondary | ICD-10-CM

## 2021-11-12 DIAGNOSIS — Z1159 Encounter for screening for other viral diseases: Secondary | ICD-10-CM

## 2021-11-12 DIAGNOSIS — M8580 Other specified disorders of bone density and structure, unspecified site: Secondary | ICD-10-CM

## 2021-11-12 DIAGNOSIS — D369 Benign neoplasm, unspecified site: Secondary | ICD-10-CM

## 2021-11-12 LAB — COMPREHENSIVE METABOLIC PANEL
ALT: 16 U/L (ref 0–35)
AST: 16 U/L (ref 0–37)
Albumin: 4.2 g/dL (ref 3.5–5.2)
Alkaline Phosphatase: 42 U/L (ref 39–117)
BUN: 20 mg/dL (ref 6–23)
CO2: 33 mEq/L — ABNORMAL HIGH (ref 19–32)
Calcium: 9.2 mg/dL (ref 8.4–10.5)
Chloride: 100 mEq/L (ref 96–112)
Creatinine, Ser: 0.73 mg/dL (ref 0.40–1.20)
GFR: 78.46 mL/min (ref >60.00)
Glucose, Bld: 83 mg/dL (ref 70–99)
Potassium: 4.1 mEq/L (ref 3.5–5.1)
Sodium: 140 mEq/L (ref 135–145)
Total Bilirubin: 0.5 mg/dL (ref 0.2–1.2)
Total Protein: 6.3 g/dL (ref 6.0–8.3)

## 2021-11-12 LAB — CBC WITH DIFFERENTIAL/PLATELET
Basophils Absolute: 0 10*3/uL (ref 0.0–0.1)
Basophils Relative: 0.8 % (ref 0.0–3.0)
Eosinophils Absolute: 0.1 10*3/uL (ref 0.0–0.7)
Eosinophils Relative: 2.1 % (ref 0.0–5.0)
HCT: 36.5 % (ref 36.0–46.0)
Hemoglobin: 12.1 g/dL (ref 12.0–15.0)
Lymphocytes Relative: 41.2 % (ref 12.0–46.0)
Lymphs Abs: 2.4 10*3/uL (ref 0.7–4.0)
MCHC: 33.2 g/dL (ref 30.0–36.0)
MCV: 91.2 fl (ref 78.0–100.0)
Monocytes Absolute: 0.4 10*3/uL (ref 0.1–1.0)
Monocytes Relative: 6.3 % (ref 3.0–12.0)
Neutro Abs: 2.9 10*3/uL (ref 1.4–7.7)
Neutrophils Relative %: 49.6 % (ref 43.0–77.0)
Platelets: 292 10*3/uL (ref 150.0–400.0)
RBC: 4.01 Mil/uL (ref 3.87–5.11)
RDW: 13.8 % (ref 11.5–15.5)
WBC: 5.9 10*3/uL (ref 4.0–10.5)

## 2021-11-12 LAB — LIPID PANEL
Cholesterol: 200 mg/dL (ref 0–200)
HDL: 80.2 mg/dL (ref >39.00)
LDL Cholesterol: 104 mg/dL — ABNORMAL HIGH (ref 0–99)
NonHDL: 119.49
Total CHOL/HDL Ratio: 2
Triglycerides: 79 mg/dL (ref 0.0–149.0)
VLDL: 15.8 mg/dL (ref 0.0–40.0)

## 2021-11-12 LAB — FERRITIN: Ferritin: 48.5 ng/mL (ref 10.0–291.0)

## 2021-11-12 NOTE — Patient Instructions (Addendum)
Call Dr. Celesta Aver office to discuss repeat colonoscopy - adenoma noted in 2017 with 5 year repeat recommended.  I will repeat labs. If iron low, recommend over the counter supplement daily.  I will let you know results of other labs.  Thanks for coming in today.

## 2021-11-12 NOTE — Progress Notes (Signed)
Subjective:  Patient ID: Kirsten Sullivan, female    DOB: [birth year]  Age: 81 y.o. MRN: [id number]  CC:  Chief Complaint  Patient presents with   Establish Care    Pt here to establish care, notes her doctor has recently retired so has been looking for new provider     HPI [name] presents for  New patient to establish care.  Previous primary care provider Dr. Bryan Lemma, then seen once by Dr. Ethelene Hal.   Prior hysterectomy for benign disease, anemia, hyperlipidemia, fibrocystic disease of breast, adenomatous polyps per problem list.  Hyperlipidemia: Treated with Zocor 10 mg 4 days per week. No myalgias. Has not tried daily.  Lab Results  Component Value Date   CHOL 188 05/28/2021   HDL 77.30 05/28/2021   LDLCALC 100 (H) 05/28/2021   TRIG 53.0 05/28/2021   CHOLHDL 2 05/28/2021   Lab Results  Component Value Date   ALT 18 05/28/2021   AST 21 05/28/2021   ALKPHOS 37 (L) 05/28/2021   BILITOT 0.5 05/28/2021   Osteopenia: Takin otc ca and vit d - unknown total dose.  BMD in July 2020 - t score -1.3 at radius. FRAX 8.3%, 0.9% hip.   Anemia: Borderline most of her life.  No known hx of PUD/GIB No prior transfusion. No current iron supplement. Has taken iron in past if low - not recent. No recent iron studies.  Does take B12 supplement.  Colonoscopy 09/15/16, adenomatous polyp removed, recommended repeat testing in 5 years. Has been advised not needed d/t age on recent letter. She would like to have another colonoscopy.  Lab Results  Component Value Date   WBC 5.5 05/28/2021   HGB 11.6 (L) 05/28/2021   HCT 34.3 (L) 05/28/2021   MCV 92.5 05/28/2021   PLT 269.0 05/28/2021   Shingrix - due for 2nd vaccine at pharmacy. Initial 05/20/20.   History Patient Active Problem List   Diagnosis Date Noted   H/O bone density study 05/16/2013   H/O hysterectomy for benign disease 03/11/2012   Anemia    Fibrocystic breast    Hyperlipidemia    Adenomatous polyps     Past Medical History:  Diagnosis Date   Adenomatous polyps    colonoscopy 07/07/2011 Dr. Marcelle Smiling, [place]   Anemia    Fibrocystic breast    Hyperlipidemia    Past Surgical History:  Procedure Laterality Date   ABDOMINAL HYSTERECTOMY     BREAST BIOPSY     right- benign   COLONOSCOPY     POLYPECTOMY     No Known Allergies Prior to Admission medications   Medication Sig Start Date End Date Taking? Authorizing Provider  CALCIUM CARB-CHOLECALCIFEROL PO Take by mouth. 500mg -59mcg   Yes [provider]  Cyanocobalamin (VITAMIN B-12 CR PO) Take 5,000 mcg by mouth daily.    Yes [provider]  Multiple Vitamins-Minerals (CENTRUM SILVER PO) Take 1 tablet by mouth daily.   Yes [provider]  Omega-3 Fatty Acids (FISH OIL TRIPLE STRENGTH) 1400 MG CAPS Take 1,200 mg by mouth daily.    Yes [provider]  simvastatin (ZOCOR) 10 MG tablet TAKE 1 TABLET (10 MG TOTAL) BY MOUTH AT BEDTIME. TAKE ONE TABLET MON, WEDS, FRIDAY AND SATURDAY 05/28/21 05/28/22 Yes Libby Maw, MD  vitamin C (ASCORBIC ACID) 500 MG tablet Take 500 mg by mouth daily.   Yes [provider]  VITAMIN E PO Take 800 mg by mouth daily.   Yes [provider]  Social History   Socioeconomic History   Marital status: Single    Spouse name: Not on file   Number of children: Not on file   Years of education: Not on file   Highest education level: Not on file  Occupational History   Occupation: Retired  Tobacco Use   Smoking status: Never   Smokeless tobacco: Never  Vaping Use   Vaping Use: Never used  Substance and Sexual Activity   Alcohol use: Not Currently   Drug use: No   Sexual activity: Not Currently  Other Topics Concern   Not on file  Social History Narrative   Not on file   Social Determinants of Health   Financial Resource Strain: Low Risk    Difficulty of Paying Living Expenses: Not hard at all  Food Insecurity: No Food  Insecurity   Worried About Charity fundraiser in the Last Year: Never true   [place] in the Last Year: Never true  Transportation Needs: No Transportation Needs   Lack of Transportation (Medical): No   Lack of Transportation (Non-Medical): No  Physical Activity: Insufficiently Active   Days of Exercise per Week: 1 day   Minutes of Exercise per Session: 30 min  Stress: No Stress Concern Present   Feeling of Stress : Not at all  Social Connections: Moderately Integrated   Frequency of Communication with Friends and Family: More than three times a week   Frequency of Social Gatherings with Friends and Family: More than three times a week   Attends Religious Services: More than 4 times per year   Active Member of [employer] or Organizations: Yes   Attends Music therapist: More than 4 times per year   Marital Status: Never married  Human resources officer Violence: Not At Risk   Fear of Current or Ex-Partner: No   Emotionally Abused: No   Physically Abused: No   Sexually Abused: No    Review of Systems Per HPI   Objective:   Vitals:   11/12/21 0928  BP: 122/70  Pulse: 64  Resp: 17  Temp: 98 F (36.7 C)  TempSrc: Temporal  SpO2: 99%  Weight: 139 lb (63 kg)  Height: 5\' 3"  (1.6 m)     Physical Exam Vitals reviewed.  Constitutional:      Appearance: Normal appearance. She is well-developed.  HENT:     Head: Normocephalic and atraumatic.  Eyes:     Conjunctiva/sclera: Conjunctivae normal.     Pupils: Pupils are equal, round, and reactive to light.  Neck:     Vascular: No carotid bruit.  Cardiovascular:     Rate and Rhythm: Normal rate and regular rhythm.     Heart sounds: Normal heart sounds.  Pulmonary:     Effort: Pulmonary effort is normal.     Breath sounds: Normal breath sounds.  Abdominal:     General: Abdomen is flat.     Palpations: Abdomen is soft. There is no pulsatile mass.     Tenderness: There is no abdominal tenderness.  Musculoskeletal:      Right lower leg: No edema.     Left lower leg: No edema.  Skin:    General: Skin is warm and dry.  Neurological:     Mental Status: She is alert and oriented to person, place, and time.  Psychiatric:        Mood and Affect: Mood normal.        Behavior: Behavior normal.  Assessment & Plan:  [name] is a 81 y.o. female . Mixed hyperlipidemia - Plan: Comprehensive metabolic panel, Lipid panel  -Tolerating current regimen, continue same, updated labs ordered.  Osteopenia, unspecified location - Plan: DG Bone Density  -Osteopenia with relatively low FRAX score previously.  Updated testing ordered.  Calcium and vitamin D recommended including minimum doses.  Anemia, unspecified type - Plan: CBC with Differential/Platelet, Ferritin Adenomatous polyp  -Updated anemia testing with ferritin level.  History of adenomatous polyp on prior colonoscopy in 2017, recommended contacting gastroenterology as may need repeat even though she is over 77.  Disposition per gastroenterology.  Potentially may need to restart iron supplementation pending the lab results.  Encounter for hepatitis C screening test for low risk patient - Plan: Hepatitis C antibody   No orders of the defined types were placed in this encounter.  Patient Instructions  Call Dr. Celesta Aver office to discuss repeat colonoscopy - adenoma noted in 2017 with 5 year repeat recommended.  I will repeat labs. If iron low, recommend over the counter supplement daily.  I will let you know results of other labs.  Thanks for coming in today.       Signed,   Merri Ray, MD [place] 11/12/21 2:17 PM

## 2021-11-15 LAB — HEPATITIS C ANTIBODY
Hepatitis C Ab: NONREACTIVE
SIGNAL TO CUT-OFF: <0.02 (ref <1.00)

## 2021-11-23 ENCOUNTER — Other Ambulatory Visit (HOSPITAL_BASED_OUTPATIENT_CLINIC_OR_DEPARTMENT_OTHER): Payer: Self-pay

## 2021-11-23 MED ORDER — SHINGRIX 50 MCG/0.5ML IM SUSR
INTRAMUSCULAR | 0 refills | Status: DC
  Filled 2021-11-23: qty 1, 30d supply, fill #0

## 2021-11-25 ENCOUNTER — Ambulatory Visit (INDEPENDENT_AMBULATORY_CARE_PROVIDER_SITE_OTHER): Payer: Medicare Other

## 2021-11-25 DIAGNOSIS — Z0000 Encounter for general adult medical examination without abnormal findings: Secondary | ICD-10-CM

## 2021-11-25 NOTE — Patient Instructions (Signed)
Kirsten Sullivan , Thank you for taking time to come for your Medicare Wellness Visit. I appreciate your ongoing commitment to your health goals. Please review the following plan we discussed and let me know if I can assist you in the future.   Screening recommendations/referrals: Colonoscopy: no longer required  Mammogram: no longer required  Bone Density: ordered 11/12/2021 Recommended yearly ophthalmology/optometry visit for glaucoma screening and checkup Recommended yearly dental visit for hygiene and checkup  Vaccinations: Influenza vaccine: completed  Pneumococcal vaccine: completed  Tdap vaccine: 04/17/2021 Shingles vaccine: completed     Advanced directives: yes   Conditions/risks identified: none   Next appointment: none    Preventive Care 33 Years and Older, Female Preventive care refers to lifestyle choices and visits with your health care provider that can promote health and wellness. What does preventive care include? A yearly physical exam. This is also called an annual well check. Dental exams once or twice a year. Routine eye exams. Ask your health care provider how often you should have your eyes checked. Personal lifestyle choices, including: Daily care of your teeth and gums. Regular physical activity. Eating a healthy diet. Avoiding tobacco and drug use. Limiting alcohol use. Practicing safe sex. Taking low-dose aspirin every day. Taking vitamin and mineral supplements as recommended by your health care provider. What happens during an annual well check? The services and screenings done by your health care provider during your annual well check will depend on your age, overall health, lifestyle risk factors, and family history of disease. Counseling  Your health care provider may ask you questions about your: Alcohol use. Tobacco use. Drug use. Emotional well-being. Home and relationship well-being. Sexual activity. Eating habits. History of  falls. Memory and ability to understand (cognition). Work and work Statistician. Reproductive health. Screening  You may have the following tests or measurements: Height, weight, and BMI. Blood pressure. Lipid and cholesterol levels. These may be checked every 5 years, or more frequently if you are over 13 years old. Skin check. Lung cancer screening. You may have this screening every year starting at age 71 if you have a 30-pack-year history of smoking and currently smoke or have quit within the past 15 years. Fecal occult blood test (FOBT) of the stool. You may have this test every year starting at age 20. Flexible sigmoidoscopy or colonoscopy. You may have a sigmoidoscopy every 5 years or a colonoscopy every 10 years starting at age 81. Hepatitis C blood test. Hepatitis B blood test. Sexually transmitted disease (STD) testing. Diabetes screening. This is done by checking your blood sugar (glucose) after you have not eaten for a while (fasting). You may have this done every 1-3 years. Bone density scan. This is done to screen for osteoporosis. You may have this done starting at age 60. Mammogram. This may be done every 1-2 years. Talk to your health care provider about how often you should have regular mammograms. Talk with your health care provider about your test results, treatment options, and if necessary, the need for more tests. Vaccines  Your health care provider may recommend certain vaccines, such as: Influenza vaccine. This is recommended every year. Tetanus, diphtheria, and acellular pertussis (Tdap, Td) vaccine. You may need a Td booster every 10 years. Zoster vaccine. You may need this after age 50. Pneumococcal 13-valent conjugate (PCV13) vaccine. One dose is recommended after age 58. Pneumococcal polysaccharide (PPSV23) vaccine. One dose is recommended after age 53. Talk to your health care provider about which screenings and  vaccines you need and how often you need  them. This information is not intended to replace advice given to you by your health care provider. Make sure you discuss any questions you have with your health care provider. Document Released: 10/16/2015 Document Revised: 06/08/2016 Document Reviewed: 07/21/2015 Elsevier Interactive Patient Education  2017 [employer] Prevention in the Home Falls can cause injuries. They can happen to people of all ages. There are many things you can do to make your home safe and to help prevent falls. What can I do on the outside of my home? Regularly fix the edges of walkways and driveways and fix any cracks. Remove anything that might make you trip as you walk through a door, such as a raised step or threshold. Trim any bushes or trees on the path to your home. Use bright outdoor lighting. Clear any walking paths of anything that might make someone trip, such as rocks or tools. Regularly check to see if handrails are loose or broken. Make sure that both sides of any steps have handrails. Any raised decks and porches should have guardrails on the edges. Have any leaves, snow, or ice cleared regularly. Use sand or salt on walking paths during winter. Clean up any spills in your garage right away. This includes oil or grease spills. What can I do in the bathroom? Use night lights. Install grab bars by the toilet and in the tub and shower. Do not use towel bars as grab bars. Use non-skid mats or decals in the tub or shower. If you need to sit down in the shower, use a plastic, non-slip stool. Keep the floor dry. Clean up any water that spills on the floor as soon as it happens. Remove soap buildup in the tub or shower regularly. Attach bath mats securely with double-sided non-slip rug tape. Do not have throw rugs and other things on the floor that can make you trip. What can I do in the bedroom? Use night lights. Make sure that you have a light by your bed that is easy to reach. Do not use  any sheets or blankets that are too big for your bed. They should not hang down onto the floor. Have a firm chair that has side arms. You can use this for support while you get dressed. Do not have throw rugs and other things on the floor that can make you trip. What can I do in the kitchen? Clean up any spills right away. Avoid walking on wet floors. Keep items that you use a lot in easy-to-reach places. If you need to reach something above you, use a strong step stool that has a grab bar. Keep electrical cords out of the way. Do not use floor polish or wax that makes floors slippery. If you must use wax, use non-skid floor wax. Do not have throw rugs and other things on the floor that can make you trip. What can I do with my stairs? Do not leave any items on the stairs. Make sure that there are handrails on both sides of the stairs and use them. Fix handrails that are broken or loose. Make sure that handrails are as long as the stairways. Check any carpeting to make sure that it is firmly attached to the stairs. Fix any carpet that is loose or worn. Avoid having throw rugs at the top or bottom of the stairs. If you do have throw rugs, attach them to the floor with carpet tape. Make  sure that you have a light switch at the top of the stairs and the bottom of the stairs. If you do not have them, ask someone to add them for you. What else can I do to help prevent falls? Wear shoes that: Do not have high heels. Have rubber bottoms. Are comfortable and fit you well. Are closed at the toe. Do not wear sandals. If you use a stepladder: Make sure that it is fully opened. Do not climb a closed stepladder. Make sure that both sides of the stepladder are locked into place. Ask someone to hold it for you, if possible. Clearly mark and make sure that you can see: Any grab bars or handrails. First and last steps. Where the edge of each step is. Use tools that help you move around (mobility aids)  if they are needed. These include: Canes. Walkers. Scooters. Crutches. Turn on the lights when you go into a dark area. Replace any light bulbs as soon as they burn out. Set up your furniture so you have a clear path. Avoid moving your furniture around. If any of your floors are uneven, fix them. If there are any pets around you, be aware of where they are. Review your medicines with your doctor. Some medicines can make you feel dizzy. This can increase your chance of falling. Ask your doctor what other things that you can do to help prevent falls. This information is not intended to replace advice given to you by your health care provider. Make sure you discuss any questions you have with your health care provider. Document Released: 07/16/2009 Document Revised: 02/25/2016 Document Reviewed: 10/24/2014 Elsevier Interactive Patient Education  2017 [employer].

## 2021-11-25 NOTE — Progress Notes (Signed)
Subjective:   Kirsten Sullivan is a 81 y.o. female who presents for Medicare Annual (Subsequent) preventive examination.   I connected with Kirsten Sullivan today by telephone and verified that I am speaking with the correct person using two identifiers. Location patient: home Location provider: work Persons participating in the virtual visit: patient, provider.   I discussed the limitations, risks, security and privacy concerns of performing an evaluation and management service by telephone and the availability of in person appointments. I also discussed with the patient that there may be a patient responsible charge related to this service. The patient expressed understanding and verbally consented to this telephonic visit.    Interactive audio and video telecommunications were attempted between this provider and patient, however failed, due to patient having technical difficulties OR patient did not have access to video capability.  We continued and completed visit with audio only.    Review of Systems     Cardiac Risk Factors include: advanced age (>49men, >38 women)     Objective:    Today's Vitals   There is no height or weight on file to calculate BMI.  Advanced Directives 11/25/2021 04/17/2021 12/01/2020 11/27/2019 09/01/2016 11/30/2015  Does Patient Have a Medical Advance Directive? Yes Yes Yes Yes Yes Yes  Type of Paramedic of [place];Living will [place];Living will [place];Living will [place];Living will [place];Living will [place];Living will  Does patient want to make changes to medical advance directive? - - - No - Patient declined - No - Patient declined  Copy of [place] in Chart? No - copy requested - No - copy requested No - copy requested - No - copy requested    Current Medications (verified) Outpatient Encounter  Medications as of 11/25/2021  Medication Sig   CALCIUM CARB-CHOLECALCIFEROL PO Take by mouth. 500mg -42mcg   Cyanocobalamin (VITAMIN B-12 CR PO) Take 5,000 mcg by mouth daily.    Multiple Vitamins-Minerals (CENTRUM SILVER PO) Take 1 tablet by mouth daily.   Omega-3 Fatty Acids (FISH OIL TRIPLE STRENGTH) 1400 MG CAPS Take 1,200 mg by mouth daily.    simvastatin (ZOCOR) 10 MG tablet TAKE 1 TABLET (10 MG TOTAL) BY MOUTH AT BEDTIME. TAKE ONE TABLET MON, WEDS, FRIDAY AND SATURDAY   vitamin C (ASCORBIC ACID) 500 MG tablet Take 500 mg by mouth daily.   VITAMIN E PO Take 800 mg by mouth daily.   Zoster Vaccine Adjuvanted [facility]) injection Inject into the muscle.   Facility-Administered Encounter Medications as of 11/25/2021  Medication   0.9 %  sodium chloride infusion    Allergies (verified) Patient has no known allergies.   History: Past Medical History:  Diagnosis Date   Adenomatous polyps    colonoscopy 07/07/2011 Dr. Marcelle [place]   Anemia    Fibrocystic breast    Hyperlipidemia    Past Surgical History:  Procedure Laterality Date   ABDOMINAL HYSTERECTOMY     BREAST BIOPSY     right- benign   COLONOSCOPY     POLYPECTOMY     Family History  Problem Relation Age of Onset   Alzheimer's disease Mother    Prostate cancer Father    Macular degeneration Father    Hypertension Father    Gout Maternal Grandmother    Heart disease Maternal Grandfather 91   Colitis Sister    Colon cancer Neg Hx    Colon polyps Neg Hx  Rectal cancer Neg Hx    Stomach cancer Neg Hx    Breast cancer Neg Hx    Social History   Socioeconomic History   Marital status: Single    Spouse name: Not on file   Number of children: Not on file   Years of education: Not on file   Highest education level: Not on file  Occupational History   Occupation: Retired  Tobacco Use   Smoking status: Never   Smokeless tobacco: Never  Vaping Use   Vaping Use: Never used  Substance and Sexual  Activity   Alcohol use: Not Currently   Drug use: No   Sexual activity: Not Currently  Other Topics Concern   Not on file  Social History Narrative   Not on file   Social Determinants of Health   Financial Resource Strain: Low Risk    Difficulty of Paying Living Expenses: Not hard at all  Food Insecurity: No Food Insecurity   Worried About Charity fundraiser in the Last Year: Never true   [place] in the Last Year: Never true  Transportation Needs: No Transportation Needs   Lack of Transportation (Medical): No   Lack of Transportation (Non-Medical): No  Physical Activity: Insufficiently Active   Days of Exercise per Week: 3 days   Minutes of Exercise per Session: 30 min  Stress: No Stress Concern Present   Feeling of Stress : Not at all  Social Connections: Moderately Integrated   Frequency of Communication with Friends and Family: Three times a week   Frequency of Social Gatherings with Friends and Family: Three times a week   Attends Religious Services: More than 4 times per year   Active Member of Clubs or Organizations: Yes   Attends Music therapist: More than 4 times per year   Marital Status: Never married    Tobacco Counseling Counseling given: Not Answered   Clinical Intake:  Pre-visit preparation completed: Yes  Pain : No/denies pain     Nutritional Risks: None  How often do you need to have someone help you when you read instructions, pamphlets, or other written materials from your doctor or pharmacy?: 1 - Never What is the last grade level you completed in school?: masters  Diabetic?no   Interpreter Needed?: No  Information entered by :: [place] of Daily Living In your present state of health, do you have any difficulty performing the following activities: 11/25/2021 11/24/2021  Hearing? N N  Vision? N N  Difficulty concentrating or making decisions? N N  Walking or climbing stairs? N N  Dressing or  bathing? N N  Doing errands, shopping? N N  Preparing Food and eating ? N N  Using the Toilet? N N  In the past six months, have you accidently leaked urine? N N  Do you have problems with loss of bowel control? N N  Managing your Medications? N N  Managing your Finances? N N  Housekeeping or managing your Housekeeping? N N  Some recent data might be hidden    Patient Care Team: Wendie Agreste, MD as PCP - General (Family Medicine) Monna Fam, MD as Consulting Physician (Ophthalmology)  Indicate any recent Medical Services you may have received from other than [facility] providers in the past year (date may be approximate).     Assessment:   This is a routine wellness examination for Kirsten Sullivan.  Hearing/Vision screen Vision Screening - Comments:: Annual eye exams  Dietary issues and exercise activities discussed: Current Exercise Habits: Home exercise routine, Type of exercise: walking, Time (Minutes): 30, Frequency (Times/Week): 3, Weekly Exercise (Minutes/Week): 90, Intensity: Mild, Exercise limited by: None identified   Goals Addressed   None    Depression Screen PHQ 2/9 Scores 11/25/2021 11/25/2021 11/12/2021 05/28/2021 12/01/2020 11/27/2019 11/15/2019  PHQ - 2 Score 0 0 0 0 0 0 0    Fall Risk Fall Risk  11/25/2021 11/24/2021 11/22/2021 11/12/2021 05/28/2021  Falls in the past year? 0 0 0 0 0  Number falls in past yr: 0 - - 0 -  Injury with Fall? 0 - - 0 -  Risk for fall due to : No Fall Risks - - No Fall Risks -  Follow up Falls evaluation completed - - Falls evaluation completed -    FALL RISK PREVENTION PERTAINING TO THE HOME:  Any stairs in or around the home? Yes  If so, are there any without handrails? No  Home free of loose throw rugs in walkways, pet beds, electrical cords, etc? Yes  Adequate lighting in your home to reduce risk of falls? Yes   ASSISTIVE DEVICES UTILIZED TO PREVENT FALLS:  Life alert? No  Use of a cane, walker or w/c? No  Grab bars in the  bathroom? No  Shower chair or bench in shower? No  Elevated toilet seat or a handicapped toilet? No    Cognitive Function:  Normal cognitive status assessed by direct observation by this Nurse Health Advisor. No abnormalities found.        Immunizations Immunization History  Administered Date(s) Administered   Fluad Quad(high Dose 65+) 05/23/2019   Influenza Split 07/11/2012, 08/04/2015   Influenza, High Dose Seasonal PF 08/04/2015   Influenza,inj,Quad PF,6+ Mos 07/05/2013, 07/25/2014   Influenza-Unspecified 07/22/2020, 07/20/2021   PFIZER Comirnaty(Gray Top)Covid-19 Tri-Sucrose Vaccine 03/19/2021   PFIZER(Purple Top)SARS-COV-2 Vaccination 10/15/2019, 11/04/2019, 05/12/2020   Pfizer Covid-19 Vaccine Bivalent Booster 67yrs & up 07/20/2021   Pneumococcal Conjugate-13 08/18/2014   Pneumococcal Polysaccharide-23 05/16/2013   Tdap 02/21/2012, 04/17/2021   Zoster Recombinat (Shingrix) 05/20/2020   Zoster, Live 10/04/2007    TDAP status: Up to date  Flu Vaccine status: Up to date  Pneumococcal vaccine status: Up to date  Covid-19 vaccine status: Completed vaccines  Qualifies for Shingles Vaccine? Yes   Zostavax completed Yes   Shingrix Completed?: Yes  Screening Tests Health Maintenance  Topic Date Due   COLONOSCOPY (Pts 45-75yrs Insurance coverage will need to be confirmed)  09/16/2019   Zoster Vaccines- Shingrix (2 of 2) 07/15/2020   MAMMOGRAM  01/14/2022   TETANUS/TDAP  04/18/2031   Pneumonia Vaccine 41+ Years old  Completed   INFLUENZA VACCINE  Completed   DEXA SCAN  Completed   COVID-19 Vaccine  Completed   Hepatitis C Screening  Completed   HPV VACCINES  Aged Out    Health Maintenance  Health Maintenance Due  Topic Date Due   COLONOSCOPY (Pts 45-94yrs Insurance coverage will need to be confirmed)  09/16/2019   Zoster Vaccines- Shingrix (2 of 2) 07/15/2020    Colorectal cancer screening: No longer required.   Mammogram status: No longer required due to  age.  Bone Density status: Completed 04/08/2019. Results reflect: Bone density results: OSTEOPENIA. Repeat every 3 years.  Lung Cancer Screening: (Low Dose CT Chest recommended if Age 54-80 years, 30 pack-year currently smoking OR have quit w/in 15years.) does not qualify.   Lung Cancer Screening Referral: n/a  Additional Screening:  Hepatitis C Screening: does  not qualify;  Vision Screening: Recommended annual ophthalmology exams for early detection of glaucoma and other disorders of the eye. Is the patient up to date with their annual eye exam?  Yes  Who is the provider or what is the name of the office in which the patient attends annual eye exams? Dr.Hecker  If pt is not established with a provider, would they like to be referred to a provider to establish care? No .   Dental Screening: Recommended annual dental exams for proper oral hygiene  Community Resource Referral / Chronic Care Management: CRR required this visit?  No   CCM required this visit?  No      Plan:     I have personally reviewed and noted the following in the patients chart:   Medical and social history Use of alcohol, tobacco or illicit drugs  Current medications and supplements including opioid prescriptions.  Functional ability and status Nutritional status Physical activity Advanced directives List of other physicians Hospitalizations, surgeries, and ER visits in previous 12 months Vitals Screenings to include cognitive, depression, and falls Referrals and appointments  In addition, I have reviewed and discussed with patient certain preventive protocols, quality metrics, and best practice recommendations. A written personalized care plan for preventive services as well as general preventive health recommendations were provided to patient.     Randel Pigg, LPN   05/27/7492   Nurse Notes: none

## 2021-11-30 ENCOUNTER — Ambulatory Visit: Payer: Medicare Other | Admitting: Family Medicine

## 2021-12-01 ENCOUNTER — Other Ambulatory Visit: Payer: Self-pay | Admitting: Family Medicine

## 2021-12-01 ENCOUNTER — Telehealth: Payer: Self-pay | Admitting: Internal Medicine

## 2021-12-01 DIAGNOSIS — M8580 Other specified disorders of bone density and structure, unspecified site: Secondary | ICD-10-CM

## 2021-12-01 NOTE — Telephone Encounter (Signed)
Inbound call from patient stating that PCP advised her to have colon. Patient aware that she is not in need of another colon per Dr. Carlean Purl. Patient seeking advise if that is still in place so she can let her PCP know. Please advise.  ?

## 2021-12-01 NOTE — Telephone Encounter (Signed)
Pt was made aware of Letter that was sent to Pt on 10/10/2019 stating that I was not necessary to repeat Colon at her age unless there is a change in her status GI related: ?Pt verbalized understanding with all questions answered.  ? ?

## 2021-12-07 ENCOUNTER — Ambulatory Visit: Payer: Medicare Other

## 2022-01-19 ENCOUNTER — Ambulatory Visit
Admission: RE | Admit: 2022-01-19 | Discharge: 2022-01-19 | Disposition: A | Discharge status: Home / Self Care | Payer: Medicare Other | Source: Ambulatory Visit | Attending: Family Medicine | Admitting: Family Medicine

## 2022-01-19 DIAGNOSIS — M8580 Other specified disorders of bone density and structure, unspecified site: Secondary | ICD-10-CM

## 2022-01-31 ENCOUNTER — Other Ambulatory Visit (HOSPITAL_BASED_OUTPATIENT_CLINIC_OR_DEPARTMENT_OTHER): Payer: Self-pay

## 2022-04-08 ENCOUNTER — Ambulatory Visit: Payer: Medicare Other | Attending: Internal Medicine

## 2022-04-08 DIAGNOSIS — Z23 Encounter for immunization: Secondary | ICD-10-CM

## 2022-04-08 NOTE — Progress Notes (Signed)
   Covid-19 Vaccination Clinic  Name:  Kirsten Sullivan    MRN: [id number] DOB: August 14, 1941  04/08/2022  [name] was observed post Covid-19 immunization for 15 minutes without incident. She was provided with Vaccine Information Sheet and instruction to access the V-Safe system.   [name] was instructed to call 911 with any severe reactions post vaccine: Difficulty breathing  Swelling of face and throat  A fast heartbeat  A bad rash all over body  Dizziness and weakness   Immunizations Administered     Name Date Dose VIS Date Route   Pfizer Covid-19 Vaccine Bivalent Booster 04/08/2022  1:28 PM 0.3 mL 06/02/2021 Intramuscular   Manufacturer: [place]   Lot: [id number]   [place]: [phone]

## 2022-04-19 ENCOUNTER — Other Ambulatory Visit (HOSPITAL_BASED_OUTPATIENT_CLINIC_OR_DEPARTMENT_OTHER): Payer: Self-pay

## 2022-04-19 MED ORDER — PFIZER COVID-19 VAC BIVALENT 30 MCG/0.3ML IM SUSP
INTRAMUSCULAR | 0 refills | Status: DC
  Filled 2022-04-19: qty 0.3, 1d supply, fill #0

## 2022-04-21 ENCOUNTER — Other Ambulatory Visit (HOSPITAL_BASED_OUTPATIENT_CLINIC_OR_DEPARTMENT_OTHER): Payer: Self-pay

## 2022-05-09 ENCOUNTER — Ambulatory Visit
Admission: RE | Admit: 2022-05-09 | Discharge: 2022-05-09 | Disposition: A | Discharge status: Home / Self Care | Payer: BLUE CROSS/BLUE SHIELD | Source: Ambulatory Visit | Attending: Family Medicine | Admitting: Family Medicine

## 2022-05-09 DIAGNOSIS — M8580 Other specified disorders of bone density and structure, unspecified site: Secondary | ICD-10-CM

## 2022-05-12 ENCOUNTER — Ambulatory Visit: Payer: Medicare Other | Admitting: Family Medicine

## 2022-05-12 ENCOUNTER — Other Ambulatory Visit (HOSPITAL_BASED_OUTPATIENT_CLINIC_OR_DEPARTMENT_OTHER): Payer: Self-pay

## 2022-05-12 ENCOUNTER — Encounter: Payer: Self-pay | Admitting: Family Medicine

## 2022-05-12 VITALS — BP 120/72 | HR 58 | Temp 98.6°F | Resp 16 | Ht 63.0 in | Wt 136.0 lb

## 2022-05-12 DIAGNOSIS — S81802A Unspecified open wound, left lower leg, initial encounter: Secondary | ICD-10-CM

## 2022-05-12 DIAGNOSIS — D649 Anemia, unspecified: Secondary | ICD-10-CM

## 2022-05-12 DIAGNOSIS — M8580 Other specified disorders of bone density and structure, unspecified site: Secondary | ICD-10-CM

## 2022-05-12 DIAGNOSIS — E782 Mixed hyperlipidemia: Secondary | ICD-10-CM | POA: Diagnosis not present

## 2022-05-12 LAB — COMPREHENSIVE METABOLIC PANEL
ALT: 16 U/L (ref 0–35)
AST: 18 U/L (ref 0–37)
Albumin: 4.3 g/dL (ref 3.5–5.2)
Alkaline Phosphatase: 38 U/L — ABNORMAL LOW (ref 39–117)
BUN: 20 mg/dL (ref 6–23)
CO2: 31 mEq/L (ref 19–32)
Calcium: 8.9 mg/dL (ref 8.4–10.5)
Chloride: 101 mEq/L (ref 96–112)
Creatinine, Ser: 0.81 mg/dL (ref 0.40–1.20)
GFR: 69.01 mL/min (ref >60.00)
Glucose, Bld: 94 mg/dL (ref 70–99)
Potassium: 4 mEq/L (ref 3.5–5.1)
Sodium: 138 mEq/L (ref 135–145)
Total Bilirubin: 0.5 mg/dL (ref 0.2–1.2)
Total Protein: 6.9 g/dL (ref 6.0–8.3)

## 2022-05-12 LAB — FERRITIN: Ferritin: 61.5 ng/mL (ref 10.0–291.0)

## 2022-05-12 LAB — LIPID PANEL
Cholesterol: 197 mg/dL (ref 0–200)
HDL: 74.4 mg/dL (ref >39.00)
LDL Cholesterol: 106 mg/dL — ABNORMAL HIGH (ref 0–99)
NonHDL: 122.43
Total CHOL/HDL Ratio: 3
Triglycerides: 84 mg/dL (ref 0.0–149.0)
VLDL: 16.8 mg/dL (ref 0.0–40.0)

## 2022-05-12 MED ORDER — SIMVASTATIN 10 MG PO TABS
ORAL_TABLET | ORAL | 3 refills | Status: DC
  Filled 2022-05-12: qty 48, fill #0
  Filled 2022-07-12: qty 48, 84d supply, fill #0
  Filled 2022-10-01: qty 48, 84d supply, fill #1

## 2022-05-12 NOTE — Progress Notes (Signed)
Subjective:  Patient ID: Kirsten Sullivan, female    DOB: 11-May-1941  Age: 81 y.o. MRN: [id number]  CC:  Chief Complaint  Patient presents with   Skin Problem    Pt has spot on her Lt shin/lower leg that will scab frequently and then heal and has been present about 1 year maybe little less     HPI Kirsten Sullivan presents for   Left lower leg lesion: New concern.Present for last year. No known injury. Initially looked like a bump, then scabbed over. Would remove scab as it healed, but never healed and repeat scab formation. Now just appears as a persistent wound, no scab.  Tx: neosporin.  No dermatologist. No hx of skin CA.   Osteopenia: Recent BMD. Overall stable. Taking Citracal supplement '1200mg'$  CA, 1000iu D3.  Plans on increased exercise.  Sister and brother in law recently admitted to [facility] memory care.   Hyperlipidemia: Taking statin 4 days per week - tolerating well. Fasting.  Lab Results  Component Value Date   CHOL 200 11/12/2021   HDL 80.20 11/12/2021   LDLCALC 104 (H) 11/12/2021   TRIG 79.0 11/12/2021   CHOLHDL 2 11/12/2021   Lab Results  Component Value Date   ALT 16 11/12/2021   AST 16 11/12/2021   ALKPHOS 42 11/12/2021   BILITOT 0.5 11/12/2021    Anemia On iron daily. Requests repeat labs.  No dark stools.  Lab Results  Component Value Date   FERRITIN 48.5 11/12/2021   Lab Results  Component Value Date   WBC 5.9 11/12/2021   HGB 12.1 11/12/2021   HCT 36.5 11/12/2021   MCV 91.2 11/12/2021   PLT 292.0 11/12/2021       History Patient Active Problem List   Diagnosis Date Noted   H/O bone density study 05/16/2013   H/O hysterectomy for benign disease 03/11/2012   Anemia    Fibrocystic breast    Hyperlipidemia    Adenomatous polyps    Past Medical History:  Diagnosis Date   Adenomatous polyps    colonoscopy 07/07/2011 Dr. [place]elle [place]   Anemia    Fibrocystic breast    Hyperlipidemia    Past Surgical  History:  Procedure Laterality Date   ABDOMINAL HYSTERECTOMY     BREAST BIOPSY     right- benign   COLONOSCOPY     POLYPECTOMY     No Known Allergies Prior to Admission medications   Medication Sig Start Date End Date Taking? Authorizing Provider  CALCIUM CARB-CHOLECALCIFEROL PO Take by mouth. '500mg'$ -13mg   Yes [provider]  Cyanocobalamin (VITAMIN B-12 CR PO) Take 5,000 mcg by mouth daily.    Yes [provider]  Multiple Vitamins-Minerals (CENTRUM SILVER PO) Take 1 tablet by mouth daily.   Yes [provider]  Omega-3 Fatty Acids (FISH OIL TRIPLE STRENGTH) 1400 MG CAPS Take 1,200 mg by mouth daily.    Yes [provider]  simvastatin (ZOCOR) 10 MG tablet TAKE 1 TABLET (10 MG TOTAL) BY MOUTH AT BEDTIME. TAKE ONE TABLET MON, WEDS, FRIDAY AND SATURDAY 05/28/21 07/18/22 Yes KLibby Maw MD  vitamin C (ASCORBIC ACID) 500 MG tablet Take 500 mg by mouth daily.   Yes [provider]  VITAMIN E PO Take 800 mg by mouth daily.   Yes [provider]   Social History   Socioeconomic History   Marital status: Single    Spouse name: Not on file   Number of children: Not on  file   Years of education: Not on file   Highest education level: Not on file  Occupational History   Occupation: Retired  Tobacco Use   Smoking status: Never   Smokeless tobacco: Never  Vaping Use   Vaping Use: Never used  Substance and Sexual Activity   Alcohol use: Not Currently   Drug use: No   Sexual activity: Not Currently  Other Topics Concern   Not on file  Social History Narrative   Not on file   Social Determinants of Health   Financial Resource Strain: Low Risk  (11/25/2021)   Overall Financial Resource Strain (CARDIA)    Difficulty of Paying Living Expenses: Not hard at all  Food Insecurity: No Food Insecurity (11/25/2021)   Hunger Vital Sign    Worried About Running Out of Food in the Last Year: Never true    [place] in the  Last Year: Never true  Transportation Needs: Unknown (11/25/2021)   PRAPARE - Hydrologist (Medical): No    Lack of Transportation (Non-Medical): Not on file  Physical Activity: Insufficiently Active (11/25/2021)   Exercise Vital Sign    Days of Exercise per Week: 3 days    Minutes of Exercise per Session: 30 min  Stress: No Stress Concern Present (11/25/2021)   [employer]    Feeling of Stress : Not at all  Social Connections: Moderately Integrated (11/25/2021)   Social Connection and Isolation Panel [NHANES]    Frequency of Communication with Friends and Family: Three times a week    Frequency of Social Gatherings with Friends and Family: Three times a week    Attends Religious Services: More than 4 times per year    Active Member of Clubs or Organizations: Yes    Attends Archivist Meetings: More than 4 times per year    Marital Status: Never married  Intimate Partner Violence: Not At Risk (11/25/2021)   Humiliation, Afraid, Rape, and Kick questionnaire    Fear of Current or Ex-Partner: No    Emotionally Abused: No    Physically Abused: No    Sexually Abused: No    Review of Systems  Per hpi.  Objective:   Vitals:   05/12/22 0903  BP: 120/72  Pulse: (!) 58  Resp: 16  Temp: 98.6 F (37 C)  TempSrc: Oral  SpO2: 98%  Weight: 136 lb (61.7 kg)  Height: '5\' 3"'$  (1.6 m)     Physical Exam Constitutional:      General: She is not in acute distress.    Appearance: Normal appearance. She is well-developed.  HENT:     Head: Normocephalic and atraumatic.  Cardiovascular:     Rate and Rhythm: Normal rate.  Pulmonary:     Effort: Pulmonary effort is normal.  Skin:    Comments: Left lower leg lesion, 1 cm across central shallow ulceration, slight discolored nevus superior aspect.  See photo.  Neurological:     Mental Status: She is alert and oriented to person, place, and  time.  Psychiatric:        Mood and Affect: Mood normal.      30 minutes spent during visit, including chart review, counseling and assimilation of information, exam, discussion of plan, discussion of wound calcium vitamin D supplementation for osteopenia and review of results with patient, and chart completion.    Assessment & Plan:  Kirsten Sullivan is a 81 y.o.  female . Osteopenia, unspecified location  -Stable on recent testing, continue calcium, vitamin D supplement.  Mixed hyperlipidemia - Plan: Comprehensive metabolic panel, Lipid panel  -Tolerating current dosing of statin, continue same.  Updated labs ordered, refilled for 4 day per week dosing.  Anemia, unspecified type - Plan: Ferritin  -Continue ferritin, updated ferritin ordered.  Wound of left lower extremity, initial encounter - Plan: Ambulatory referral to Dermatology  -Nonhealing wound, concern for possible underlying skin cancer/nevus.  Refer to dermatology.  Wound care and RTC precautions given.  No orders of the defined types were placed in this encounter.  Patient Instructions  Keep wound clean and covered if it does appear wet.  I will refer you to dermatology but if any acute worsening including redness around the wound, drainage, or swelling, be seen right away.  I do not expect that to happen.  No other med changes.  I will check some labs today.   Take care.     Signed,   Merri Ray, MD [place] 05/12/22 9:44 AM

## 2022-05-12 NOTE — Patient Instructions (Signed)
Keep wound clean and covered if it does appear wet.  I will refer you to dermatology but if any acute worsening including redness around the wound, drainage, or swelling, be seen right away.  I do not expect that to happen.  No other med changes.  I will check some labs today.   Take care.

## 2022-06-22 ENCOUNTER — Other Ambulatory Visit (HOSPITAL_BASED_OUTPATIENT_CLINIC_OR_DEPARTMENT_OTHER): Payer: Self-pay

## 2022-06-22 MED ORDER — FLUAD QUADRIVALENT 0.5 ML IM PRSY
PREFILLED_SYRINGE | INTRAMUSCULAR | 0 refills | Status: DC
  Filled 2022-06-22: qty 0.5, 1d supply, fill #0

## 2022-07-12 ENCOUNTER — Other Ambulatory Visit (HOSPITAL_BASED_OUTPATIENT_CLINIC_OR_DEPARTMENT_OTHER): Payer: Self-pay

## 2022-07-26 ENCOUNTER — Other Ambulatory Visit (HOSPITAL_BASED_OUTPATIENT_CLINIC_OR_DEPARTMENT_OTHER): Payer: Self-pay

## 2022-07-26 LAB — HM DIABETES EYE EXAM

## 2022-07-26 MED ORDER — COMIRNATY 30 MCG/0.3ML IM SUSY
PREFILLED_SYRINGE | INTRAMUSCULAR | 0 refills | Status: DC
  Filled 2022-07-26: qty 0.3, 1d supply, fill #0

## 2022-07-27 ENCOUNTER — Telehealth: Payer: Self-pay

## 2022-07-27 ENCOUNTER — Encounter: Payer: Self-pay | Admitting: Family Medicine

## 2022-07-27 NOTE — Telephone Encounter (Signed)
Received eye exam notes from [employer]. Placed in the front bin for abstraction

## 2022-07-27 NOTE — Telephone Encounter (Signed)
Documented and placed in review folder

## 2022-08-01 ENCOUNTER — Telehealth: Payer: Self-pay | Admitting: Lab

## 2022-08-01 NOTE — Telephone Encounter (Signed)
Placed information from [facility] in Dr. Mancel Bale folder

## 2022-09-05 ENCOUNTER — Other Ambulatory Visit (HOSPITAL_BASED_OUTPATIENT_CLINIC_OR_DEPARTMENT_OTHER): Payer: Self-pay

## 2022-11-15 ENCOUNTER — Other Ambulatory Visit: Payer: Self-pay | Admitting: Family Medicine

## 2022-11-15 DIAGNOSIS — Z1231 Encounter for screening mammogram for malignant neoplasm of breast: Secondary | ICD-10-CM

## 2022-11-16 ENCOUNTER — Other Ambulatory Visit (HOSPITAL_BASED_OUTPATIENT_CLINIC_OR_DEPARTMENT_OTHER): Payer: Self-pay

## 2022-11-16 ENCOUNTER — Encounter: Payer: Self-pay | Admitting: Family Medicine

## 2022-11-16 ENCOUNTER — Ambulatory Visit (INDEPENDENT_AMBULATORY_CARE_PROVIDER_SITE_OTHER): Payer: Medicare Other | Admitting: Family Medicine

## 2022-11-16 VITALS — BP 126/70 | HR 64 | Temp 98.1°F | Ht 62.25 in | Wt 138.2 lb

## 2022-11-16 DIAGNOSIS — M8580 Other specified disorders of bone density and structure, unspecified site: Secondary | ICD-10-CM

## 2022-11-16 DIAGNOSIS — D649 Anemia, unspecified: Secondary | ICD-10-CM | POA: Diagnosis not present

## 2022-11-16 DIAGNOSIS — Z0000 Encounter for general adult medical examination without abnormal findings: Secondary | ICD-10-CM

## 2022-11-16 DIAGNOSIS — E782 Mixed hyperlipidemia: Secondary | ICD-10-CM

## 2022-11-16 LAB — CBC WITH DIFFERENTIAL/PLATELET
Basophils Absolute: 0 10*3/uL (ref 0.0–0.1)
Basophils Relative: 0.5 % (ref 0.0–3.0)
Eosinophils Absolute: 0.1 10*3/uL (ref 0.0–0.7)
Eosinophils Relative: 1.4 % (ref 0.0–5.0)
HCT: 37.7 % (ref 36.0–46.0)
Hemoglobin: 13 g/dL (ref 12.0–15.0)
Lymphocytes Relative: 45.2 % (ref 12.0–46.0)
Lymphs Abs: 2.8 10*3/uL (ref 0.7–4.0)
MCHC: 34.4 g/dL (ref 30.0–36.0)
MCV: 92.2 fl (ref 78.0–100.0)
Monocytes Absolute: 0.4 10*3/uL (ref 0.1–1.0)
Monocytes Relative: 6.5 % (ref 3.0–12.0)
Neutro Abs: 2.8 10*3/uL (ref 1.4–7.7)
Neutrophils Relative %: 46.4 % (ref 43.0–77.0)
Platelets: 315 10*3/uL (ref 150.0–400.0)
RBC: 4.09 Mil/uL (ref 3.87–5.11)
RDW: 13.2 % (ref 11.5–15.5)
WBC: 6.1 10*3/uL (ref 4.0–10.5)

## 2022-11-16 LAB — COMPREHENSIVE METABOLIC PANEL
ALT: 19 U/L (ref 0–35)
AST: 19 U/L (ref 0–37)
Albumin: 4.3 g/dL (ref 3.5–5.2)
Alkaline Phosphatase: 40 U/L (ref 39–117)
BUN: 19 mg/dL (ref 6–23)
CO2: 32 mEq/L (ref 19–32)
Calcium: 9.3 mg/dL (ref 8.4–10.5)
Chloride: 100 mEq/L (ref 96–112)
Creatinine, Ser: 0.87 mg/dL (ref 0.40–1.20)
GFR: 63.11 mL/min (ref >60.00)
Glucose, Bld: 93 mg/dL (ref 70–99)
Potassium: 3.9 mEq/L (ref 3.5–5.1)
Sodium: 139 mEq/L (ref 135–145)
Total Bilirubin: 0.5 mg/dL (ref 0.2–1.2)
Total Protein: 7 g/dL (ref 6.0–8.3)

## 2022-11-16 LAB — LIPID PANEL
Cholesterol: 210 mg/dL — ABNORMAL HIGH (ref 0–200)
HDL: 76.2 mg/dL (ref >39.00)
LDL Cholesterol: 120 mg/dL — ABNORMAL HIGH (ref 0–99)
NonHDL: 133.95
Total CHOL/HDL Ratio: 3
Triglycerides: 71 mg/dL (ref 0.0–149.0)
VLDL: 14.2 mg/dL (ref 0.0–40.0)

## 2022-11-16 MED ORDER — SIMVASTATIN 10 MG PO TABS
ORAL_TABLET | ORAL | 3 refills | Status: DC
  Filled 2022-11-16: qty 48, fill #0
  Filled 2022-12-13: qty 48, 84d supply, fill #0
  Filled 2023-02-24: qty 48, 84d supply, fill #1
  Filled 2023-05-04: qty 48, 84d supply, fill #2
  Filled 2023-07-08: qty 48, 84d supply, fill #3

## 2022-11-16 NOTE — Progress Notes (Signed)
Subjective:  Patient ID: Kirsten Sullivan, female    DOB: [birth year]  Age: 82 y.o. MRN: [id number]  CC:  Chief Complaint  Patient presents with   Annual Exam    Pt is fasting no concerns, no chest pains     HPI [name] presents for Annual Exam Doing ok. No new health changes.   Hyperlipidemia: Zocor 16m  4 times per week. No myalgia/side effects at this dose.  Lab Results  Component Value Date   CHOL 197 05/12/2022   HDL 74.40 05/12/2022   LDLCALC 106 (H) 05/12/2022   TRIG 84.0 05/12/2022   CHOLHDL 3 05/12/2022   Lab Results  Component Value Date   ALT 16 05/12/2022   AST 18 05/12/2022   ALKPHOS 38 (L) 05/12/2022   BILITOT 0.5 05/12/2022   Anemia: On iron supplement daily. No melena/hematochezia, no new fatigue.  Lab Results  Component Value Date   WBC 5.9 11/12/2021   HGB 12.1 11/12/2021   HCT 36.5 11/12/2021   MCV 91.2 11/12/2021   PLT 292.0 11/12/2021   Osteopenia.  On citracal daily - 12045mCA, 1000iuD3.      11/16/2022    9:27 AM 05/12/2022    9:02 AM 11/25/2021    9:04 AM 11/25/2021    9:02 AM 11/12/2021    9:31 AM  Depression screen PHQ 2/9  Decreased Interest 0 0 0 0 0  Down, Depressed, Hopeless 0 0 0 0 0  PHQ - 2 Score 0 0 0 0 0    Health Maintenance  Topic Date Due   Medicare Annual Wellness (AWV)  11/25/2022   Zoster Vaccines- Shingrix (2 of 2) 02/14/2023 (Originally 07/15/2020)   MAMMOGRAM  01/20/2023   DTaP/Tdap/Td (3 - Td or Tdap) 04/18/2031   Pneumonia Vaccine 6545Years old  Completed   INFLUENZA VACCINE  Completed   DEXA SCAN  Completed   COVID-19 Vaccine  Completed   Hepatitis C Screening  Completed   HPV VACCINES  Aged Out   COLONOSCOPY (Pts [id number] coverage will need to be confirmed)  Discontinued  Mammogram scheduled in April. Bone density 05/09/22.   Immunization History  Administered Date(s) Administered   COVID-19, mRNA, vaccine(Comirnaty)12 years and older 07/26/2022   Fluad Quad(high Dose 65+)  05/23/2019, 06/22/2022   Influenza Split 07/11/2012, 08/04/2015   Influenza, High Dose Seasonal PF 08/04/2015   Influenza,inj,Quad PF,6+ Mos 07/05/2013, 07/25/2014   Influenza-Unspecified 07/22/2020, 07/20/2021   PFIZER Comirnaty(Gray Top)Covid-19 Tri-Sucrose Vaccine 03/19/2021   PFIZER(Purple Top)SARS-COV-2 Vaccination 10/15/2019, 11/04/2019, 05/12/2020   Pfizer Covid-19 Vaccine Bivalent Booster 12y71yrup 07/20/2021, 04/08/2022   Pneumococcal Conjugate-13 08/18/2014   Pneumococcal Polysaccharide-23 05/16/2013   Tdap 02/21/2012, 04/17/2021   Zoster Recombinat (Shingrix) 05/20/2020   Zoster, Live 10/04/2007  RSV vaccine: had at pharmacy.  2nd shingrix - had at pharmacy.  Covid booster - utd.   No results found. Optho - in past year.   Dental:Within Last 6 months  Alcohol:none  Tobacco: none  Exercise:minimal - plans to restart.    History Patient Active Problem List   Diagnosis Date Noted   H/O bone density study 05/16/2013   H/O hysterectomy for benign disease 03/11/2012   Anemia    Fibrocystic breast    Hyperlipidemia    Adenomatous polyps    Past Medical History:  Diagnosis Date   Adenomatous polyps    colonoscopy 07/07/2011 Dr. KannMarcelle Smilinga   Anemia    Cataract    Fibrocystic breast  Hyperlipidemia    Past Surgical History:  Procedure Laterality Date   ABDOMINAL HYSTERECTOMY     BREAST BIOPSY     right- benign   COLONOSCOPY     EYE SURGERY  2012   Removal of cataracts   POLYPECTOMY     No Known Allergies Prior to Admission medications   Medication Sig Start Date End Date Taking? Authorizing Provider  CALCIUM CARB-CHOLECALCIFEROL PO Take by mouth. 568m-25mcg   Yes [provider]  Cyanocobalamin (VITAMIN B-12 CR PO) Take 5,000 mcg by mouth daily.    Yes [provider]  Multiple Vitamins-Minerals (CENTRUM SILVER PO) Take 1 tablet by mouth daily.   Yes [provider]  Omega-3 Fatty Acids (FISH OIL TRIPLE  STRENGTH) 1400 MG CAPS Take 1,200 mg by mouth daily.    Yes [provider]  simvastatin (ZOCOR) 10 MG tablet TAKE 1 TABLET (10 MG TOTAL) BY MOUTH AT BEDTIME. TAKE ONE TABLET MON, WEDS, FRIDAY AND SATURDAY 05/12/22 05/12/23 Yes GWendie Agreste MD  vitamin C (ASCORBIC ACID) 500 MG tablet Take 500 mg by mouth daily.   Yes [provider]  VITAMIN E PO Take 800 mg by mouth daily.   Yes [provider]   Social History   Socioeconomic History   Marital status: Single    Spouse name: Not on file   Number of children: Not on file   Years of education: Not on file   Highest education level: Not on file  Occupational History   Occupation: Retired  Tobacco Use   Smoking status: Never   Smokeless tobacco: Never  Vaping Use   Vaping Use: Never used  Substance and Sexual Activity   Alcohol use: Not Currently   Drug use: No   Sexual activity: Not Currently  Other Topics Concern   Not on file  Social History Narrative   Not on file   Social Determinants of Health   Financial Resource Strain: Low Risk  (11/25/2021)   Overall Financial Resource Strain (CARDIA)    Difficulty of Paying Living Expenses: Not hard at all  Food Insecurity: No Food Insecurity (11/25/2021)   Hunger Vital Sign    Worried About Running Out of Food in the Last Year: Never true    R[place]in the Last Year: Never true  Transportation Needs: Unknown (11/25/2021)   PRAPARE - THydrologist(Medical): No    Lack of Transportation (Non-Medical): Not on file  Physical Activity: Insufficiently Active (11/25/2021)   Exercise Vital Sign    Days of Exercise per Week: 3 days    Minutes of Exercise per Session: 30 min  Stress: No Stress Concern Present (11/25/2021)   [place]   Feeling of Stress : Not at all  Social Connections: Moderately Integrated (11/25/2021)   Social Connection and Isolation  Panel [NHANES]    Frequency of Communication with Friends and Family: Three times a week    Frequency of Social Gatherings with Friends and Family: Three times a week    Attends Religious Services: More than 4 times per year    Active Member of Clubs or Organizations: Yes    Attends CArchivistMeetings: More than 4 times per year    Marital Status: Never married  Intimate Partner Violence: Not At Risk (11/25/2021)   Humiliation, Afraid, Rape, and Kick questionnaire    Fear of Current or Ex-Partner: No  Emotionally Abused: No    Physically Abused: No    Sexually Abused: No    Review of Systems 13 point review of systems per patient health survey noted.  Negative other than as indicated above or in HPI.    Objective:   Vitals:   11/16/22 0931  BP: 126/70  Pulse: 64  Temp: 98.1 F (36.7 C)  TempSrc: Temporal  SpO2: 96%  Weight: 138 lb 3.2 oz (62.7 kg)  Height: 5' 2.25" (1.581 m)     Physical Exam Constitutional:      Appearance: She is well-developed.  HENT:     Head: Normocephalic and atraumatic.     Right Ear: External ear normal.     Left Ear: External ear normal.  Eyes:     Conjunctiva/sclera: Conjunctivae normal.     Pupils: Pupils are equal, round, and reactive to light.  Neck:     Thyroid: No thyromegaly.  Cardiovascular:     Rate and Rhythm: Normal rate and regular rhythm.     Heart sounds: Normal heart sounds. No murmur heard. Pulmonary:     Effort: Pulmonary effort is normal. No respiratory distress.     Breath sounds: Normal breath sounds. No wheezing.  Abdominal:     General: Bowel sounds are normal.     Palpations: Abdomen is soft.     Tenderness: There is no abdominal tenderness.  Musculoskeletal:        General: No tenderness. Normal range of motion.     Cervical back: Normal range of motion and neck supple.  Lymphadenopathy:     Cervical: No cervical adenopathy.  Skin:    General: Skin is warm and dry.     Findings: No rash.   Neurological:     Mental Status: She is alert and oriented to person, place, and time.  Psychiatric:        Behavior: Behavior normal.        Thought Content: Thought content normal.     Assessment & Plan:  Sherylann Prieur is a 82 y.o. female . Annual physical exam  - -anticipatory guidance as below in AVS, screening labs above. Health maintenance items as above in HPI discussed/recommended as applicable.   Anemia, unspecified type - Plan: CBC with Differential/Platelet  -Chronic, stable previously, continue iron, check updated CBC.  Mixed hyperlipidemia - Plan: Comprehensive metabolic panel, Lipid panel, simvastatin (ZOCOR) 10 MG tablet  -Tolerating 4 days/week simvastatin, check updated labs.  Adjust plan accordingly  Osteopenia, unspecified location  -Continue calcium, vitamin D, repeat bone density next year   Meds ordered this encounter  Medications   simvastatin (ZOCOR) 10 MG tablet    Sig: TAKE 1 TABLET (10 MG TOTAL) BY MOUTH AT BEDTIME. TAKE ONE TABLET MON, WEDS, FRIDAY AND SATURDAY    Dispense:  48 tablet    Refill:  3   Patient Instructions  If you did not receive second shingrix - that can be given at your pharmacy.  No changes today.take care!  Preventive Care 52 Years and Older, Female Preventive care refers to lifestyle choices and visits with your health care provider that can promote health and wellness. Preventive care visits are also called wellness exams. What can I expect for my preventive care visit? Counseling Your health care provider may ask you questions about your: Medical history, including: Past medical problems. Family medical history. Pregnancy and menstrual history. History of falls. Current health, including: Memory and ability to understand (cognition). Emotional well-being. Home life and relationship  well-being. Sexual activity and sexual health. Lifestyle, including: Alcohol, nicotine or tobacco, and drug use. Access to  firearms. Diet, exercise, and sleep habits. Work and work Statistician. Sunscreen use. Safety issues such as seatbelt and bike helmet use. Physical exam Your health care provider will check your: Height and weight. These may be used to calculate your BMI (body mass index). BMI is a measurement that tells if you are at a healthy weight. Waist circumference. This measures the distance around your waistline. This measurement also tells if you are at a healthy weight and may help predict your risk of certain diseases, such as type 2 diabetes and high blood pressure. Heart rate and blood pressure. Body temperature. Skin for abnormal spots. What immunizations do I need?  Vaccines are usually given at various ages, according to a schedule. Your health care provider will recommend vaccines for you based on your age, medical history, and lifestyle or other factors, such as travel or where you work. What tests do I need? Screening Your health care provider may recommend screening tests for certain conditions. This may include: Lipid and cholesterol levels. Hepatitis C test. Hepatitis B test. HIV (human immunodeficiency virus) test. STI (sexually transmitted infection) testing, if you are at risk. Lung cancer screening. Colorectal cancer screening. Diabetes screening. This is done by checking your blood sugar (glucose) after you have not eaten for a while (fasting). Mammogram. Talk with your health care provider about how often you should have regular mammograms. BRCA-related cancer screening. This may be done if you have a family history of breast, ovarian, tubal, or peritoneal cancers. Bone density scan. This is done to screen for osteoporosis. Talk with your health care provider about your test results, treatment options, and if necessary, the need for more tests. Follow these instructions at home: Eating and drinking  Eat a diet that includes fresh fruits and vegetables, whole grains, lean  protein, and low-fat dairy products. Limit your intake of foods with high amounts of sugar, saturated fats, and salt. Take vitamin and mineral supplements as recommended by your health care provider. Do not drink alcohol if your health care provider tells you not to drink. If you drink alcohol: Limit how much you have to 0-1 drink a day. Know how much alcohol is in your drink. In the [place] one drink equals one 12 oz bottle of beer (355 mL), one 5 oz glass of wine (148 mL), or one 1 oz glass of hard liquor (44 mL). Lifestyle Brush your teeth every morning and night with fluoride toothpaste. Floss one time each day. Exercise for at least 30 minutes 5 or more days each week. Do not use any products that contain nicotine or tobacco. These products include cigarettes, chewing tobacco, and vaping devices, such as e-cigarettes. If you need help quitting, ask your health care provider. Do not use drugs. If you are sexually active, practice safe sex. Use a condom or other form of protection in order to prevent STIs. Take aspirin only as told by your health care provider. Make sure that you understand how much to take and what form to take. Work with your health care provider to find out whether it is safe and beneficial for you to take aspirin daily. Ask your health care provider if you need to take a cholesterol-lowering medicine (statin). Find healthy ways to manage stress, such as: Meditation, yoga, or listening to music. Journaling. Talking to a trusted person. Spending time with friends and family. Minimize exposure to UV radiation  to reduce your risk of skin cancer. Safety Always wear your seat belt while driving or riding in a vehicle. Do not drive: If you have been drinking alcohol. Do not ride with someone who has been drinking. When you are tired or distracted. While texting. If you have been using any mind-altering substances or drugs. Wear a helmet and other protective equipment during  sports activities. If you have firearms in your house, make sure you follow all gun safety procedures. What's next? Visit your health care provider once a year for an annual wellness visit. Ask your health care provider how often you should have your eyes and teeth checked. Stay up to date on all vaccines. This information is not intended to replace advice given to you by your health care provider. Make sure you discuss any questions you have with your health care provider. Document Revised: 03/17/2021 Document Reviewed: 03/17/2021 Elsevier Patient Education  [place],   Merri Ray, MD [place] 11/16/22 10:23 AM

## 2022-11-16 NOTE — Patient Instructions (Addendum)
If you did not receive second shingrix - that can be given at your pharmacy.  No changes today.take care!  Preventive Care 73 Years and Older, Female Preventive care refers to lifestyle choices and visits with your health care provider that can promote health and wellness. Preventive care visits are also called wellness exams. What can I expect for my preventive care visit? Counseling Your health care provider may ask you questions about your: Medical history, including: Past medical problems. Family medical history. Pregnancy and menstrual history. History of falls. Current health, including: Memory and ability to understand (cognition). Emotional well-being. Home life and relationship well-being. Sexual activity and sexual health. Lifestyle, including: Alcohol, nicotine or tobacco, and drug use. Access to firearms. Diet, exercise, and sleep habits. Work and work Statistician. Sunscreen use. Safety issues such as seatbelt and bike helmet use. Physical exam Your health care provider will check your: Height and weight. These may be used to calculate your BMI (body mass index). BMI is a measurement that tells if you are at a healthy weight. Waist circumference. This measures the distance around your waistline. This measurement also tells if you are at a healthy weight and may help predict your risk of certain diseases, such as type 2 diabetes and high blood pressure. Heart rate and blood pressure. Body temperature. Skin for abnormal spots. What immunizations do I need?  Vaccines are usually given at various ages, according to a schedule. Your health care provider will recommend vaccines for you based on your age, medical history, and lifestyle or other factors, such as travel or where you work. What tests do I need? Screening Your health care provider may recommend screening tests for certain conditions. This may include: Lipid and cholesterol levels. Hepatitis C test. Hepatitis  B test. HIV (human immunodeficiency virus) test. STI (sexually transmitted infection) testing, if you are at risk. Lung cancer screening. Colorectal cancer screening. Diabetes screening. This is done by checking your blood sugar (glucose) after you have not eaten for a while (fasting). Mammogram. Talk with your health care provider about how often you should have regular mammograms. BRCA-related cancer screening. This may be done if you have a family history of breast, ovarian, tubal, or peritoneal cancers. Bone density scan. This is done to screen for osteoporosis. Talk with your health care provider about your test results, treatment options, and if necessary, the need for more tests. Follow these instructions at home: Eating and drinking  Eat a diet that includes fresh fruits and vegetables, whole grains, lean protein, and low-fat dairy products. Limit your intake of foods with high amounts of sugar, saturated fats, and salt. Take vitamin and mineral supplements as recommended by your health care provider. Do not drink alcohol if your health care provider tells you not to drink. If you drink alcohol: Limit how much you have to 0-1 drink a day. Know how much alcohol is in your drink. In the [place] one drink equals one 12 oz bottle of beer (355 mL), one 5 oz glass of wine (148 mL), or one 1 oz glass of hard liquor (44 mL). Lifestyle Brush your teeth every morning and night with fluoride toothpaste. Floss one time each day. Exercise for at least 30 minutes 5 or more days each week. Do not use any products that contain nicotine or tobacco. These products include cigarettes, chewing tobacco, and vaping devices, such as e-cigarettes. If you need help quitting, ask your health care provider. Do not use drugs. If you are sexually active, practice  safe sex. Use a condom or other form of protection in order to prevent STIs. Take aspirin only as told by your health care provider. Make sure that you  understand how much to take and what form to take. Work with your health care provider to find out whether it is safe and beneficial for you to take aspirin daily. Ask your health care provider if you need to take a cholesterol-lowering medicine (statin). Find healthy ways to manage stress, such as: Meditation, yoga, or listening to music. Journaling. Talking to a trusted person. Spending time with friends and family. Minimize exposure to UV radiation to reduce your risk of skin cancer. Safety Always wear your seat belt while driving or riding in a vehicle. Do not drive: If you have been drinking alcohol. Do not ride with someone who has been drinking. When you are tired or distracted. While texting. If you have been using any mind-altering substances or drugs. Wear a helmet and other protective equipment during sports activities. If you have firearms in your house, make sure you follow all gun safety procedures. What's next? Visit your health care provider once a year for an annual wellness visit. Ask your health care provider how often you should have your eyes and teeth checked. Stay up to date on all vaccines. This information is not intended to replace advice given to you by your health care provider. Make sure you discuss any questions you have with your health care provider. Document Revised: 03/17/2021 Document Reviewed: 03/17/2021 Elsevier Patient Education  [place].

## 2022-12-01 ENCOUNTER — Ambulatory Visit (INDEPENDENT_AMBULATORY_CARE_PROVIDER_SITE_OTHER): Payer: Medicare Other

## 2022-12-01 VITALS — Ht 62.25 in | Wt 138.0 lb

## 2022-12-01 DIAGNOSIS — Z0000 Encounter for general adult medical examination without abnormal findings: Secondary | ICD-10-CM | POA: Diagnosis not present

## 2022-12-01 NOTE — Patient Instructions (Addendum)
Ms. Kirsten Sullivan , Thank you for taking time to come for your Medicare Wellness Visit. I appreciate your ongoing commitment to your health goals. Please review the following plan we discussed and let me know if I can assist you in the future.   These are the goals we discussed:  Goals       Increase physical activity (pt-stated)      Maintain healthy lifestyle and independence        This is a list of the screening recommended for you and due dates:  Health Maintenance  Topic Date Due   Mammogram  01/20/2023   Medicare Annual Wellness Visit  12/01/2023   DTaP/Tdap/Td vaccine (3 - Td or Tdap) 04/18/2031   Pneumonia Vaccine  Completed   Flu Shot  Completed   DEXA scan (bone density measurement)  Completed   COVID-19 Vaccine  Completed   Zoster (Shingles) Vaccine  Completed   HPV Vaccine  Aged Out   Colon Cancer Screening  Discontinued    Advanced directives: Please bring a copy of your health care power of attorney and living will to the office to be added to your chart at your convenience.   Conditions/risks identified: None  Next appointment: Follow up in one year for your annual wellness visit     Preventive Care 65 Years and Older, Female Preventive care refers to lifestyle choices and visits with your health care provider that can promote health and wellness. What does preventive care include? A yearly physical exam. This is also called an annual well check. Dental exams once or twice a year. Routine eye exams. Ask your health care provider how often you should have your eyes checked. Personal lifestyle choices, including: Daily care of your teeth and gums. Regular physical activity. Eating a healthy diet. Avoiding tobacco and drug use. Limiting alcohol use. Practicing safe sex. Taking low-dose aspirin every day. Taking vitamin and mineral supplements as recommended by your health care provider. What happens during an annual well check? The services and screenings  done by your health care provider during your annual well check will depend on your age, overall health, lifestyle risk factors, and family history of disease. Counseling  Your health care provider may ask you questions about your: Alcohol use. Tobacco use. Drug use. Emotional well-being. Home and relationship well-being. Sexual activity. Eating habits. History of falls. Memory and ability to understand (cognition). Work and work Statistician. Reproductive health. Screening  You may have the following tests or measurements: Height, weight, and BMI. Blood pressure. Lipid and cholesterol levels. These may be checked every 5 years, or more frequently if you are over 50 years old. Skin check. Lung cancer screening. You may have this screening every year starting at age 69 if you have a 30-pack-year history of smoking and currently smoke or have quit within the past 15 years. Fecal occult blood test (FOBT) of the stool. You may have this test every year starting at age 55. Flexible sigmoidoscopy or colonoscopy. You may have a sigmoidoscopy every 5 years or a colonoscopy every 10 years starting at age 91. Hepatitis C blood test. Hepatitis B blood test. Sexually transmitted disease (STD) testing. Diabetes screening. This is done by checking your blood sugar (glucose) after you have not eaten for a while (fasting). You may have this done every 1-3 years. Bone density scan. This is done to screen for osteoporosis. You may have this done starting at age 84. Mammogram. This may be done every 1-2 years. Talk to  your health care provider about how often you should have regular mammograms. Talk with your health care provider about your test results, treatment options, and if necessary, the need for more tests. Vaccines  Your health care provider may recommend certain vaccines, such as: Influenza vaccine. This is recommended every year. Tetanus, diphtheria, and acellular pertussis (Tdap, Td)  vaccine. You may need a Td booster every 10 years. Zoster vaccine. You may need this after age 63. Pneumococcal 13-valent conjugate (PCV13) vaccine. One dose is recommended after age 39. Pneumococcal polysaccharide (PPSV23) vaccine. One dose is recommended after age 63. Talk to your health care provider about which screenings and vaccines you need and how often you need them. This information is not intended to replace advice given to you by your health care provider. Make sure you discuss any questions you have with your health care provider. Document Released: 10/16/2015 Document Revised: 06/08/2016 Document Reviewed: 07/21/2015 Elsevier Interactive Patient Education  2017 [place] Prevention in the Home Falls can cause injuries. They can happen to people of all ages. There are many things you can do to make your home safe and to help prevent falls. What can I do on the outside of my home? Regularly fix the edges of walkways and driveways and fix any cracks. Remove anything that might make you trip as you walk through a door, such as a raised step or threshold. Trim any bushes or trees on the path to your home. Use bright outdoor lighting. Clear any walking paths of anything that might make someone trip, such as rocks or tools. Regularly check to see if handrails are loose or broken. Make sure that both sides of any steps have handrails. Any raised decks and porches should have guardrails on the edges. Have any leaves, snow, or ice cleared regularly. Use sand or salt on walking paths during winter. Clean up any spills in your garage right away. This includes oil or grease spills. What can I do in the bathroom? Use night lights. Install grab bars by the toilet and in the tub and shower. Do not use towel bars as grab bars. Use non-skid mats or decals in the tub or shower. If you need to sit down in the shower, use a plastic, non-slip stool. Keep the floor dry. Clean up any  water that spills on the floor as soon as it happens. Remove soap buildup in the tub or shower regularly. Attach bath mats securely with double-sided non-slip rug tape. Do not have throw rugs and other things on the floor that can make you trip. What can I do in the bedroom? Use night lights. Make sure that you have a light by your bed that is easy to reach. Do not use any sheets or blankets that are too big for your bed. They should not hang down onto the floor. Have a firm chair that has side arms. You can use this for support while you get dressed. Do not have throw rugs and other things on the floor that can make you trip. What can I do in the kitchen? Clean up any spills right away. Avoid walking on wet floors. Keep items that you use a lot in easy-to-reach places. If you need to reach something above you, use a strong step stool that has a grab bar. Keep electrical cords out of the way. Do not use floor polish or wax that makes floors slippery. If you must use wax, use non-skid floor wax. Do not  have throw rugs and other things on the floor that can make you trip. What can I do with my stairs? Do not leave any items on the stairs. Make sure that there are handrails on both sides of the stairs and use them. Fix handrails that are broken or loose. Make sure that handrails are as long as the stairways. Check any carpeting to make sure that it is firmly attached to the stairs. Fix any carpet that is loose or worn. Avoid having throw rugs at the top or bottom of the stairs. If you do have throw rugs, attach them to the floor with carpet tape. Make sure that you have a light switch at the top of the stairs and the bottom of the stairs. If you do not have them, ask someone to add them for you. What else can I do to help prevent falls? Wear shoes that: Do not have high heels. Have rubber bottoms. Are comfortable and fit you well. Are closed at the toe. Do not wear sandals. If you use a  stepladder: Make sure that it is fully opened. Do not climb a closed stepladder. Make sure that both sides of the stepladder are locked into place. Ask someone to hold it for you, if possible. Clearly mark and make sure that you can see: Any grab bars or handrails. First and last steps. Where the edge of each step is. Use tools that help you move around (mobility aids) if they are needed. These include: Canes. Walkers. Scooters. Crutches. Turn on the lights when you go into a dark area. Replace any light bulbs as soon as they burn out. Set up your furniture so you have a clear path. Avoid moving your furniture around. If any of your floors are uneven, fix them. If there are any pets around you, be aware of where they are. Review your medicines with your doctor. Some medicines can make you feel dizzy. This can increase your chance of falling. Ask your doctor what other things that you can do to help prevent falls. This information is not intended to replace advice given to you by your health care provider. Make sure you discuss any questions you have with your health care provider. Document Released: 07/16/2009 Document Revised: 02/25/2016 Document Reviewed: 10/24/2014 Elsevier Interactive Patient Education  2017 [employer].

## 2022-12-01 NOTE — Progress Notes (Signed)
Subjective:   Kirsten Sullivan is a 82 y.o. female who presents for Medicare Annual (Subsequent) preventive examination.  Review of Systems    Virtual Visit via Telephone Note  I connected with  Kirsten Sullivan on 12/01/22 at  8:45 AM EST by telephone and verified that I am speaking with the correct person using two identifiers.  Location: Patient: Home Provider: Office Persons participating in the virtual visit: patient/Nurse Health Advisor   I discussed the limitations, risks, security and privacy concerns of performing an evaluation and management service by telephone and the availability of in person appointments. The patient expressed understanding and agreed to proceed.  Interactive audio and video telecommunications were attempted between this nurse and patient, however failed, due to patient having technical difficulties OR patient did not have access to video capability.  We continued and completed visit with audio only.  Some vital signs may be absent or patient reported.   Criselda Peaches, LPN  Cardiac Risk Factors include: advanced age (>84mn, >>83women)     Objective:    Today's Vitals   12/01/22 0853  Weight: 138 lb (62.6 kg)  Height: 5' 2.25" (1.581 m)   Body mass index is 25.04 kg/m.     12/01/2022    9:00 AM 11/25/2021    9:03 AM 04/17/2021    3:23 PM 12/01/2020    1:34 PM 11/27/2019   10:41 AM 09/01/2016   12:48 PM 11/30/2015    2:16 PM  Advanced Directives  Does Patient Have a Medical Advance Directive? Yes Yes Yes Yes Yes Yes Yes  Type of AParamedicof [place]harborLiving will HShishmarefLiving will HWhittenLiving will [place]Living will HJuno BeachLiving will HLorainLiving will HClermontLiving will  Does patient want to make changes to medical advance directive?     No - Patient declined  No - Patient  declined  Copy of HReynoldsin Chart? No - copy requested No - copy requested  No - copy requested No - copy requested  No - copy requested    Current Medications (verified) Outpatient Encounter Medications as of 12/01/2022  Medication Sig   CALCIUM CARB-CHOLECALCIFEROL PO Take by mouth. '500mg'$ -259m   Cyanocobalamin (VITAMIN B-12 CR PO) Take 5,000 mcg by mouth daily.    Multiple Vitamins-Minerals (CENTRUM SILVER PO) Take 1 tablet by mouth daily.   Omega-3 Fatty Acids (FISH OIL TRIPLE STRENGTH) 1400 MG CAPS Take 1,200 mg by mouth daily.    simvastatin (ZOCOR) 10 MG tablet TAKE 1 TABLET (10 MG TOTAL) BY MOUTH AT BEDTIME. TAKE ONE TABLET MON, WEDS, FRIDAY AND SATURDAY   vitamin C (ASCORBIC ACID) 500 MG tablet Take 500 mg by mouth daily.   VITAMIN E PO Take 800 mg by mouth daily.   Facility-Administered Encounter Medications as of 12/01/2022  Medication   0.9 %  sodium chloride infusion    Allergies (verified) Patient has no known allergies.   History: Past Medical History:  Diagnosis Date   Adenomatous polyps    colonoscopy 07/07/2011 Dr. KaMarcelle Smilingfla   Anemia    Cataract    Fibrocystic breast    Hyperlipidemia    Past Surgical History:  Procedure Laterality Date   ABDOMINAL HYSTERECTOMY     BREAST BIOPSY     right- benign   COLONOSCOPY     EYE SURGERY  2012   Removal of cataracts  POLYPECTOMY     Family History  Problem Relation Age of Onset   Alzheimer's disease Mother    Prostate cancer Father    Macular degeneration Father    Hypertension Father    Alcohol abuse Father    Cancer Father    Gout Maternal Grandmother    Heart disease Maternal Grandfather 38   Colitis Sister    Colon cancer Neg Hx    Colon polyps Neg Hx    Rectal cancer Neg Hx    Stomach cancer Neg Hx    Breast cancer Neg Hx    Social History   Socioeconomic History   Marital status: Single    Spouse name: Not on file   Number of children: Not on file    Years of education: Not on file   Highest education level: Not on file  Occupational History   Occupation: Retired  Tobacco Use   Smoking status: Never   Smokeless tobacco: Never  Vaping Use   Vaping Use: Never used  Substance and Sexual Activity   Alcohol use: Not Currently   Drug use: No   Sexual activity: Not Currently  Other Topics Concern   Not on file  Social History Narrative   Not on file   Social Determinants of Health   Financial Resource Strain: Low Risk  (12/01/2022)   Overall Financial Resource Strain (CARDIA)    Difficulty of Paying Living Expenses: Not hard at all  Food Insecurity: No Food Insecurity (12/01/2022)   Hunger Vital Sign    Worried About Running Out of Food in the Last Year: Never true    Ran Out of Food in the Last Year: Never true  Transportation Needs: No Transportation Needs (12/01/2022)   PRAPARE - Hydrologist (Medical): No    Lack of Transportation (Non-Medical): No  Physical Activity: Inactive (12/01/2022)   Exercise Vital Sign    Days of Exercise per Week: 0 days    Minutes of Exercise per Session: 0 min  Stress: No Stress Concern Present (12/01/2022)   Ducor    Feeling of Stress : Not at all  Social Connections: Moderately Integrated (12/01/2022)   Social Connection and Isolation Panel [NHANES]    Frequency of Communication with Friends and Family: More than three times a week    Frequency of Social Gatherings with Friends and Family: More than three times a week    Attends Religious Services: More than 4 times per year    Active Member of [employer] or Organizations: Yes    Attends Music therapist: More than 4 times per year    Marital Status: Never married    Tobacco Counseling Counseling given: Not Answered   Clinical Intake:  Pre-visit preparation completed: Yes  Pain : No/denies pain     BMI - recorded:  25.04 Nutritional Status: BMI 25 -29 Overweight Nutritional Risks: None Diabetes: No  How often do you need to have someone help you when you read instructions, pamphlets, or other written materials from your doctor or pharmacy?: 1 - Never  Diabetic? No  Interpreter Needed?: No  Information entered by :: Rolene Arbour LPN   Activities of Daily Living    12/01/2022    8:57 AM 11/28/2022    2:36 PM  In your present state of health, do you have any difficulty performing the following activities:  Hearing? 0 0  Vision? 0 0  Difficulty concentrating or  making decisions? 0 0  Walking or climbing stairs? 0 0  Dressing or bathing? 0 0  Doing errands, shopping? 0 0  Preparing Food and eating ? N N  Using the Toilet? N N  In the past six months, have you accidently leaked urine? N Y  Do you have problems with loss of bowel control? N N  Managing your Medications? N N  Managing your Finances? N N  Housekeeping or managing your Housekeeping? N N    Patient Care Team: Wendie Agreste, MD as PCP - General (Family Medicine) Monna Fam, MD as Consulting Physician (Ophthalmology)  Indicate any recent Medical Services you may have received from other than [facility] providers in the past year (date may be approximate).     Assessment:   This is a routine wellness examination for Kirsten Sullivan.  Hearing/Vision screen Hearing Screening - Comments:: Denies hearing difficulties   Vision Screening - Comments::  - up to date with routine eye exams with  [place] issues and exercise activities discussed: Exercise limited by: None identified   Goals Addressed               This Visit's Progress     Increase physical activity (pt-stated)         Depression Screen    12/01/2022    8:56 AM 11/16/2022    9:27 AM 05/12/2022    9:02 AM 11/25/2021    9:04 AM 11/25/2021    9:02 AM 11/12/2021    9:31 AM 05/28/2021    8:10 AM  PHQ 2/9 Scores  PHQ - 2 Score 0 0 0 0 0 0 0     Fall Risk    12/01/2022    8:58 AM 11/28/2022    2:36 PM 11/16/2022    9:27 AM 05/12/2022    9:02 AM 11/25/2021    9:04 AM  Fall Risk   Falls in the past year? 0 0 0 0 0  Number falls in past yr: 0  0 0 0  Injury with Fall? 0  0 0 0  Risk for fall due to : No Fall Risks  No Fall Risks No Fall Risks No Fall Risks  Follow up Falls prevention discussed  Falls evaluation completed Falls evaluation completed Falls evaluation completed    FALL RISK PREVENTION PERTAINING TO THE HOME:  Any stairs in or around the home? Yes  If so, are there any without handrails? No  Home free of loose throw rugs in walkways, pet beds, electrical cords, etc? Yes  Adequate lighting in your home to reduce risk of falls? Yes   ASSISTIVE DEVICES UTILIZED TO PREVENT FALLS:  Life alert? No  Use of a cane, walker or w/c? No  Grab bars in the bathroom? No  Shower chair or bench in shower? No  Elevated toilet seat or a handicapped toilet? No   TIMED UP AND GO:  Was the test performed? No . Audio Visit    Cognitive Function:        12/01/2022    9:00 AM  6CIT Screen  What Year? 0 points  What month? 0 points  What time? 0 points  Count back from 20 0 points  Months in reverse 0 points  Repeat phrase 0 points  Total Score 0 points    Immunizations Immunization History  Administered Date(s) Administered   COVID-19, mRNA, vaccine(Comirnaty)12 years and older 07/26/2022   Fluad Quad(high Dose 65+) 05/23/2019, 06/22/2022   Influenza Split  07/11/2012, 08/04/2015   Influenza, High Dose Seasonal PF 08/04/2015   Influenza,inj,Quad PF,6+ Mos 07/05/2013, 07/25/2014   Influenza-Unspecified 07/22/2020, 07/20/2021   PFIZER Comirnaty(Gray Top)Covid-19 Tri-Sucrose Vaccine 03/19/2021   PFIZER(Purple Top)SARS-COV-2 Vaccination 10/15/2019, 11/04/2019, 05/12/2020   Pfizer Covid-19 Vaccine Bivalent Booster 64yr & up 07/20/2021, 04/08/2022   Pneumococcal Conjugate-13 08/18/2014   Pneumococcal Polysaccharide-23  05/16/2013   Tdap 02/21/2012, 04/17/2021   Zoster Recombinat (Shingrix) 05/20/2020, 02/13/2022   Zoster, Live 10/04/2007    TDAP status: Up to date  Flu Vaccine status: Up to date  Pneumococcal vaccine status: Up to date  Covid-19 vaccine status: Completed vaccines  Qualifies for Shingles Vaccine? Yes   Zostavax completed Yes   Shingrix Completed?: Yes  Screening Tests Health Maintenance  Topic Date Due   MAMMOGRAM  01/20/2023   Medicare Annual Wellness (AWV)  12/01/2023   DTaP/Tdap/Td (3 - Td or Tdap) 04/18/2031   Pneumonia Vaccine 80 Years old  Completed   INFLUENZA VACCINE  Completed   DEXA SCAN  Completed   COVID-19 Vaccine  Completed   Zoster Vaccines- Shingrix  Completed   HPV VACCINES  Aged Out   COLONOSCOPY (Pts 45-445yrInsurance coverage will need to be confirmed)  Discontinued    Health Maintenance  There are no preventive care reminders to display for this patient.   Colorectal cancer screening: No longer required.   Mammogram status: Ordered Scheduled for 01/23/23. Pt provided with contact info and advised to call to schedule appt.   Bone Density status: Completed 05/09/22. Results reflect: Bone density results: OSTEOPOROSIS. Repeat every   years.  Lung Cancer Screening: (Low Dose CT Chest recommended if Age 664-80ears, 30 pack-year currently smoking OR have quit w/in 15years.) does not qualify.     Additional Screening:  Hepatitis C Screening: does not qualify; Completed   Vision Screening: Recommended annual ophthalmology exams for early detection of glaucoma and other disorders of the eye. Is the patient up to date with their annual eye exam?  Yes  Who is the provider or what is the name of the office in which the patient attends annual eye exams? Dr HeHerbert Deanerf pt is not established with a provider, would they like to be referred to a provider to establish care? No .   Dental Screening: Recommended annual dental exams for proper oral  hygiene  Community Resource Referral / Chronic Care Management:  CRR required this visit?  No   CCM required this visit?  No      Plan:     I have personally reviewed and noted the following in the patient's chart:   Medical and social history Use of alcohol, tobacco or illicit drugs  Current medications and supplements including opioid prescriptions. Patient is not currently taking opioid prescriptions. Functional ability and status Nutritional status Physical activity Advanced directives List of other physicians Hospitalizations, surgeries, and ER visits in previous 12 months Vitals Screenings to include cognitive, depression, and falls Referrals and appointments  In addition, I have reviewed and discussed with patient certain preventive protocols, quality metrics, and best practice recommendations. A written personalized care plan for preventive services as well as general preventive health recommendations were provided to patient.     BeCriselda PeachesLPN   2/624THL Nurse Notes: None

## 2022-12-13 ENCOUNTER — Other Ambulatory Visit (HOSPITAL_BASED_OUTPATIENT_CLINIC_OR_DEPARTMENT_OTHER): Payer: Self-pay

## 2022-12-13 ENCOUNTER — Other Ambulatory Visit: Payer: Self-pay

## 2023-01-23 ENCOUNTER — Ambulatory Visit
Admission: RE | Admit: 2023-01-23 | Discharge: 2023-01-23 | Disposition: A | Discharge status: Home / Self Care | Payer: PRIVATE HEALTH INSURANCE | Source: Ambulatory Visit | Attending: Family Medicine | Admitting: Family Medicine

## 2023-01-23 DIAGNOSIS — Z1231 Encounter for screening mammogram for malignant neoplasm of breast: Secondary | ICD-10-CM

## 2023-06-22 ENCOUNTER — Other Ambulatory Visit (HOSPITAL_BASED_OUTPATIENT_CLINIC_OR_DEPARTMENT_OTHER): Payer: Self-pay

## 2023-06-22 MED ORDER — COMIRNATY 30 MCG/0.3ML IM SUSY
0.3000 mL | PREFILLED_SYRINGE | Freq: Once | INTRAMUSCULAR | 0 refills | Status: AC
  Filled 2023-06-22: qty 0.3, 1d supply, fill #0

## 2023-06-22 MED ORDER — FLUAD 0.5 ML IM SUSY
0.5000 mL | PREFILLED_SYRINGE | Freq: Once | INTRAMUSCULAR | 0 refills | Status: AC
  Filled 2023-06-22: qty 0.5, 1d supply, fill #0

## 2023-07-31 ENCOUNTER — Encounter: Payer: Self-pay | Admitting: Family Medicine

## 2023-09-04 ENCOUNTER — Other Ambulatory Visit (HOSPITAL_BASED_OUTPATIENT_CLINIC_OR_DEPARTMENT_OTHER): Payer: Self-pay

## 2023-09-07 ENCOUNTER — Other Ambulatory Visit: Payer: Self-pay | Admitting: Family Medicine

## 2023-09-07 ENCOUNTER — Other Ambulatory Visit (HOSPITAL_BASED_OUTPATIENT_CLINIC_OR_DEPARTMENT_OTHER): Payer: Self-pay

## 2023-09-07 DIAGNOSIS — E782 Mixed hyperlipidemia: Secondary | ICD-10-CM

## 2023-09-07 MED ORDER — SIMVASTATIN 10 MG PO TABS
10.0000 mg | ORAL_TABLET | ORAL | 3 refills | Status: DC
Start: 2023-09-07 — End: 2024-01-05
  Filled 2023-09-07 – 2023-09-12 (×3): qty 48, 84d supply, fill #0
  Filled 2023-11-18: qty 48, 84d supply, fill #1

## 2023-09-11 ENCOUNTER — Other Ambulatory Visit (HOSPITAL_BASED_OUTPATIENT_CLINIC_OR_DEPARTMENT_OTHER): Payer: Self-pay

## 2023-09-12 ENCOUNTER — Other Ambulatory Visit (HOSPITAL_BASED_OUTPATIENT_CLINIC_OR_DEPARTMENT_OTHER): Payer: Self-pay

## 2023-11-22 ENCOUNTER — Encounter: Payer: PRIVATE HEALTH INSURANCE | Admitting: Family Medicine

## 2023-12-01 ENCOUNTER — Other Ambulatory Visit: Payer: Self-pay | Admitting: Family Medicine

## 2023-12-01 DIAGNOSIS — Z0000 Encounter for general adult medical examination without abnormal findings: Secondary | ICD-10-CM

## 2024-01-02 ENCOUNTER — Ambulatory Visit (INDEPENDENT_AMBULATORY_CARE_PROVIDER_SITE_OTHER): Payer: PRIVATE HEALTH INSURANCE | Admitting: *Deleted

## 2024-01-02 DIAGNOSIS — Z0000 Encounter for general adult medical examination without abnormal findings: Secondary | ICD-10-CM

## 2024-01-02 NOTE — Progress Notes (Signed)
 Subjective:   Kirsten Sullivan is a 83 y.o. female who presents for Medicare Annual (Subsequent) preventive examination.  Visit Complete: Virtual I connected with  Kirsten Sullivan on 01/02/24 by a audio enabled telemedicine application and verified that I am speaking with the correct person using two identifiers.  Patient Location: Home  Provider Location: Home Office  I discussed the limitations of evaluation and management by telemedicine. The patient expressed understanding and agreed to proceed.  Vital Signs: Because this visit was a virtual/telehealth visit, some criteria may be missing or patient reported. Any vitals not documented were not able to be obtained and vitals that have been documented are patient reported.  Patient Medicare AWV questionnaire was completed by the patient on 12-29-2023; I have confirmed that all information answered by patient is correct and no changes since this date.  Cardiac Risk Factors include: advanced age (>16men, >81 women)     Objective:    There were no vitals filed for this visit. There is no height or weight on file to calculate BMI.     01/02/2024    2:33 PM 12/01/2022    9:00 AM 11/25/2021    9:03 AM 04/17/2021    3:23 PM 12/01/2020    1:34 PM 11/27/2019   10:41 AM 09/01/2016   12:48 PM  Advanced Directives  Does Patient Have a Medical Advance Directive? Yes Yes Yes Yes Yes Yes Yes  Type of Estate agent of [employer] Power of [place];Living will Healthcare Power of [place];Living will Healthcare Power of [place];Living will Healthcare Power of [place];Living will Healthcare Power of [place];Living will Healthcare Power of [place];Living will  Does patient want to make changes to medical advance directive?      No - Patient declined   Copy of Healthcare Power of Attorney in Chart? No - copy requested No - copy requested No - copy requested  No - copy requested No - copy requested     Current  Medications (verified) Outpatient Encounter Medications as of 01/02/2024  Medication Sig   Cyanocobalamin (VITAMIN B-12 CR PO) Take 5,000 mcg by mouth daily.    Multiple Vitamins-Minerals (CENTRUM SILVER PO) Take 1 tablet by mouth daily.   Omega-3 Fatty Acids (FISH OIL TRIPLE STRENGTH) 1400 MG CAPS Take 1,200 mg by mouth daily.    simvastatin (ZOCOR) 10 MG tablet Take 1 tablet (10 mg total) by mouth at bedtime on Mondays, Wednesdays, Fridays, and Saturdays.   vitamin C (ASCORBIC ACID) 500 MG tablet Take 500 mg by mouth daily.   VITAMIN E PO Take 800 mg by mouth daily.   CALCIUM CARB-CHOLECALCIFEROL PO Take by mouth. 500mg -   Facility-Administered Encounter Medications as of 01/02/2024  Medication   0.9 %  sodium chloride infusion    Allergies (verified) Patient has no known allergies.   History: Past Medical History:  Diagnosis Date   Adenomatous polyps    colonoscopy 07/07/2011 Dr. [place]man [place]   Anemia    Cataract    Fibrocystic breast    Hyperlipidemia    Past Surgical History:  Procedure Laterality Date   ABDOMINAL HYSTERECTOMY     BREAST BIOPSY     right- benign   COLONOSCOPY     EYE SURGERY  2012   Removal of cataracts   POLYPECTOMY     Family History  Problem Relation Age of Onset   Alzheimer's disease Mother    Prostate cancer Father    Macular degeneration Father    Hypertension  Father    Alcohol abuse Father    Cancer Father    Gout Maternal Grandmother    Heart disease Maternal Grandfather 23   Colitis Sister    Colon cancer Neg Hx    Colon polyps Neg Hx    Rectal cancer Neg Hx    Stomach cancer Neg Hx    Breast cancer Neg Hx    Social History   Socioeconomic History   Marital status: Single    Spouse name: Not on file   Number of children: Not on file   Years of education: Not on file   Highest education level: Master's degree (e.g., MA, MS, MEng, MEd, MSW, MBA)  Occupational History   Occupation: Retired  Tobacco Use    Smoking status: Never   Smokeless tobacco: Never  Vaping Use   Vaping status: Never Used  Substance and Sexual Activity   Alcohol use: Not Currently   Drug use: No   Sexual activity: Not Currently  Other Topics Concern   Not on file  Social History Narrative   Not on file   Social Drivers of Health   Financial Resource Strain: Low Risk  (01/02/2024)   Overall Financial Resource Strain (CARDIA)    Difficulty of Paying Living Expenses: Not hard at all  Food Insecurity: No Food Insecurity (01/02/2024)   Hunger Vital Sign    Worried About Running Out of Food in the Last Year: Never true    Ran Out of Food in the Last Year: Never true  Transportation Needs: No Transportation Needs (01/02/2024)   PRAPARE - Administrator, Civil Service (Medical): No    Lack of Transportation (Non-Medical): No  Physical Activity: Insufficiently Active (01/02/2024)   Exercise Vital Sign    Days of Exercise per Week: 1 day    Minutes of Exercise per Session: 90 min  Stress: No Stress Concern Present (01/02/2024)   [employer] of Occupational Health - Occupational Stress Questionnaire    Feeling of Stress : Not at all  Social Connections: Moderately Integrated (01/02/2024)   Social Connection and Isolation Panel [NHANES]    Frequency of Communication with Friends and Family: More than three times a week    Frequency of Social Gatherings with Friends and Family: More than three times a week    Attends Religious Services: More than 4 times per year    Active Member of [employer] or Organizations: Yes    Attends Engineer, structural: More than 4 times per year    Marital Status: Never married    Tobacco Counseling Counseling given: Not Answered   Clinical Intake:  Pre-visit preparation completed: Yes  Pain : No/denies pain     Diabetes: No  How often do you need to have someone help you when you read instructions, pamphlets, or other written materials from your doctor or  pharmacy?: 1 - Never  Interpreter Needed?: No  Information entered by :: Remi Haggard LPN   Activities of Daily Living    01/02/2024    2:35 PM 12/29/2023   12:15 PM  In your present state of health, do you have any difficulty performing the following activities:  Hearing? 0 0  Vision? 0 0  Difficulty concentrating or making decisions? 0 0  Walking or climbing stairs? 0 0  Dressing or bathing? 0 0  Doing errands, shopping? 0 0  Preparing Food and eating ? N N  Using the Toilet? N N  In the past six months, have  you accidently leaked urine? Y Y  Do you have problems with loss of bowel control? N N  Managing your Medications? N N  Managing your Finances? N N  Housekeeping or managing your Housekeeping? N N    Patient Care Team: Shade Flood, MD as PCP - General (Family Medicine) Mateo Flow, MD as Consulting Physician (Ophthalmology)  Indicate any recent Medical Services you may have received from other than [facility] providers in the past year (date may be approximate).     Assessment:   This is a routine wellness examination for [place].  Hearing/Vision screen Hearing Screening - Comments:: No trouble hearing Vision Screening - Comments:: Elmer Picker Up to date   Goals Addressed             This Visit's Progress    Patient Stated       Go to gym more frequently        Depression Screen    01/02/2024    2:36 PM 12/01/2022    8:56 AM 11/16/2022    9:27 AM 05/12/2022    9:02 AM 11/25/2021    9:04 AM 11/25/2021    9:02 AM 11/12/2021    9:31 AM  PHQ 2/9 Scores  PHQ - 2 Score 0 0 0 0 0 0 0  PHQ- 9 Score 0          Fall Risk    01/02/2024    2:35 PM 12/29/2023   12:15 PM 12/01/2022    8:58 AM 11/28/2022    2:36 PM 11/16/2022    9:27 AM  Fall Risk   Falls in the past year? 1 1 0 0 0  Number falls in past yr: 0 0 0  0  Injury with Fall? 0 0 0  0  Risk for fall due to :   No Fall Risks  No Fall Risks  Follow up Falls evaluation completed;Education provided;Falls  prevention discussed  Falls prevention discussed  Falls evaluation completed    MEDICARE RISK AT HOME: Medicare Risk at Home Any stairs in or around the home?: Yes If so, are there any without handrails?: No Home free of loose throw rugs in walkways, pet beds, electrical cords, etc?: No Adequate lighting in your home to reduce risk of falls?: Yes Life alert?: No Use of a cane, walker or w/c?: No Grab bars in the bathroom?: No Shower chair or bench in shower?: No Elevated toilet seat or a handicapped toilet?: No  TIMED UP AND GO:  Was the test performed?  No    Cognitive Function:        01/02/2024    2:34 PM 12/01/2022    9:00 AM  6CIT Screen  What Year? 0 points 0 points  What month? 0 points 0 points  What time? 0 points 0 points  Count back from 20 0 points 0 points  Months in reverse 0 points 0 points  Repeat phrase 0 points 0 points  Total Score 0 points 0 points    Immunizations Immunization History  Administered Date(s) Administered   Fluad Quad(high Dose 65+) 05/23/2019, 06/22/2022   Fluad Trivalent(High Dose 65+) 06/22/2023   Influenza Split 07/11/2012, 08/04/2015   Influenza, High Dose Seasonal PF 08/04/2015   Influenza,inj,Quad PF,6+ Mos 07/05/2013, 07/25/2014   Influenza-Unspecified 07/22/2020, 07/20/2021   PFIZER Comirnaty(Gray Top)Covid-19 Tri-Sucrose Vaccine 03/19/2021   PFIZER(Purple Top)SARS-COV-2 Vaccination 10/15/2019, 11/04/2019, 05/12/2020   Pfizer Covid-19 Vaccine Bivalent Booster 64yrs & up 07/20/2021, 04/08/2022   Pfizer(Comirnaty)Fall Seasonal Vaccine 12 years  and older 07/26/2022, 06/22/2023   Pneumococcal Conjugate-13 08/18/2014   Pneumococcal Polysaccharide-23 05/16/2013   Tdap 02/21/2012, 04/17/2021   Zoster Recombinant(Shingrix) 05/20/2020, 02/13/2022   Zoster, Live 10/04/2007    TDAP status: Up to date  Flu Vaccine status: Up to date  Pneumococcal vaccine status: Up to date  Covid-19 vaccine status: Information provided on how  to obtain vaccines.   Qualifies for Shingles Vaccine? No   Zostavax completed Yes   Shingrix Completed?: Yes  Screening Tests Health Maintenance  Topic Date Due   COVID-19 Vaccine (9 - 2024-25 season) 12/20/2023   MAMMOGRAM  01/23/2024   INFLUENZA VACCINE  05/03/2024   Medicare Annual Wellness (AWV)  01/01/2025   DTaP/Tdap/Td (3 - Td or Tdap) 04/18/2031   Pneumonia Vaccine 24+ Years old  Completed   DEXA SCAN  Completed   Zoster Vaccines- Shingrix  Completed   HPV VACCINES  Aged Out   Colonoscopy  Discontinued   Hepatitis C Screening  Discontinued    Health Maintenance  Health Maintenance Due  Topic Date Due   COVID-19 Vaccine (9 - 2024-25 season) 12/20/2023    Colorectal cancer screening: No longer required.   Mammogram status: Completed  . Repeat every year  Bone Density status: Completed 2023. Results reflect: Bone density results: OSTEOPOROSIS. Repeat every 2 years.  Lung Cancer Screening: (Low Dose CT Chest recommended if Age 80-80 years, 20 pack-year currently smoking OR have quit w/in 15years.) does not qualify.   Lung Cancer Screening Referral:   Additional Screening:  Hepatitis C Screening  never done  Vision Screening: Recommended annual ophthalmology exams for early detection of glaucoma and other disorders of the eye. Is the patient up to date with their annual eye exam?  Yes  Who is the provider or what is the name of the office in which the patient attends annual eye exams? [facility] If pt is not established with a provider, would they like to be referred to a provider to establish care? No .   Dental Screening: Recommended annual dental exams for proper oral hygiene    Community Resource Referral / Chronic Care Management: CRR required this visit?  No   CCM required this visit?  No     Plan:     I have personally reviewed and noted the following in the patient's chart:   Medical and social history Use of alcohol, tobacco or illicit drugs   Current medications and supplements including opioid prescriptions. Patient is not currently taking opioid prescriptions. Functional ability and status Nutritional status Physical activity Advanced directives List of other physicians Hospitalizations, surgeries, and ER visits in previous 12 months Vitals Screenings to include cognitive, depression, and falls Referrals and appointments  In addition, I have reviewed and discussed with patient certain preventive protocols, quality metrics, and best practice recommendations. A written personalized care plan for preventive services as well as general preventive health recommendations were provided to patient.     Remi Haggard, LPN   4/0/9811   After Visit Summary: (MyChart) Due to this being a telephonic visit, the after visit summary with patients personalized plan was offered to patient via MyChart   Nurse Notes:

## 2024-01-02 NOTE — Patient Instructions (Signed)
 Kirsten Sullivan , Thank you for taking time to come for your Medicare Wellness Visit. I appreciate your ongoing commitment to your health goals. Please review the following plan we discussed and let me know if I can assist you in the future.   Screening recommendations/referrals: Colonoscopy: no longer required Mammogram: up to date Bone Density: up to date Recommended yearly ophthalmology/optometry visit for glaucoma screening and checkup Recommended yearly dental visit for hygiene and checkup  Vaccinations: Influenza vaccine: up to date Pneumococcal vaccine: up to date Tdap vaccine: up to date Shingles vaccine: Education provided     Preventive Care 65 Years and Older, Female Preventive care refers to lifestyle choices and visits with your health care provider that can promote health and wellness. What does preventive care include? A yearly physical exam. This is also called an annual well check. Dental exams once or twice a year. Routine eye exams. Ask your health care provider how often you should have your eyes checked. Personal lifestyle choices, including: Daily care of your teeth and gums. Regular physical activity. Eating a healthy diet. Avoiding tobacco and drug use. Limiting alcohol use. Practicing safe sex. Taking low-dose aspirin every day. Taking vitamin and mineral supplements as recommended by your health care provider. What happens during an annual well check? The services and screenings done by your health care provider during your annual well check will depend on your age, overall health, lifestyle risk factors, and family history of disease. Counseling  Your health care provider may ask you questions about your: Alcohol use. Tobacco use. Drug use. Emotional well-being. Home and relationship well-being. Sexual activity. Eating habits. History of falls. Memory and ability to understand (cognition). Work and work Astronomer. Reproductive health. Screening   You may have the following tests or measurements: Height, weight, and BMI. Blood pressure. Lipid and cholesterol levels. These may be checked every 5 years, or more frequently if you are over 49 years old. Skin check. Lung cancer screening. You may have this screening every year starting at age 14 if you have a 30-pack-year history of smoking and currently smoke or have quit within the past 15 years. Fecal occult blood test (FOBT) of the stool. You may have this test every year starting at age 27. Flexible sigmoidoscopy or colonoscopy. You may have a sigmoidoscopy every 5 years or a colonoscopy every 10 years starting at age 22. Hepatitis C blood test. Hepatitis B blood test. Sexually transmitted disease (STD) testing. Diabetes screening. This is done by checking your blood sugar (glucose) after you have not eaten for a while (fasting). You may have this done every 1-3 years. Bone density scan. This is done to screen for osteoporosis. You may have this done starting at age 51. Mammogram. This may be done every 1-2 years. Talk to your health care provider about how often you should have regular mammograms. Talk with your health care provider about your test results, treatment options, and if necessary, the need for more tests. Vaccines  Your health care provider may recommend certain vaccines, such as: Influenza vaccine. This is recommended every year. Tetanus, diphtheria, and acellular pertussis (Tdap, Td) vaccine. You may need a Td booster every 10 years. Zoster vaccine. You may need this after age 60. Pneumococcal 13-valent conjugate (PCV13) vaccine. One dose is recommended after age 67. Pneumococcal polysaccharide (PPSV23) vaccine. One dose is recommended after age 4. Talk to your health care provider about which screenings and vaccines you need and how often you need them. This information is  not intended to replace advice given to you by your health care provider. Make sure you discuss  any questions you have with your health care provider. Document Released: 10/16/2015 Document Revised: 06/08/2016 Document Reviewed: 07/21/2015 Elsevier Interactive Patient Education  2017 [employer].  Fall Prevention in the Home Falls can cause injuries. They can happen to people of all ages. There are many things you can do to make your home safe and to help prevent falls. What can I do on the outside of my home? Regularly fix the edges of walkways and driveways and fix any cracks. Remove anything that might make you trip as you walk through a door, such as a raised step or threshold. Trim any bushes or trees on the path to your home. Use bright outdoor lighting. Clear any walking paths of anything that might make someone trip, such as rocks or tools. Regularly check to see if handrails are loose or broken. Make sure that both sides of any steps have handrails. Any raised decks and porches should have guardrails on the edges. Have any leaves, snow, or ice cleared regularly. Use sand or salt on walking paths during winter. Clean up any spills in your garage right away. This includes oil or grease spills. What can I do in the bathroom? Use night lights. Install grab bars by the toilet and in the tub and shower. Do not use towel bars as grab bars. Use non-skid mats or decals in the tub or shower. If you need to sit down in the shower, use a plastic, non-slip stool. Keep the floor dry. Clean up any water that spills on the floor as soon as it happens. Remove soap buildup in the tub or shower regularly. Attach bath mats securely with double-sided non-slip rug tape. Do not have throw rugs and other things on the floor that can make you trip. What can I do in the bedroom? Use night lights. Make sure that you have a light by your bed that is easy to reach. Do not use any sheets or blankets that are too big for your bed. They should not hang down onto the floor. Have a firm chair that has  side arms. You can use this for support while you get dressed. Do not have throw rugs and other things on the floor that can make you trip. What can I do in the kitchen? Clean up any spills right away. Avoid walking on wet floors. Keep items that you use a lot in easy-to-reach places. If you need to reach something above you, use a strong step stool that has a grab bar. Keep electrical cords out of the way. Do not use floor polish or wax that makes floors slippery. If you must use wax, use non-skid floor wax. Do not have throw rugs and other things on the floor that can make you trip. What can I do with my stairs? Do not leave any items on the stairs. Make sure that there are handrails on both sides of the stairs and use them. Fix handrails that are broken or loose. Make sure that handrails are as long as the stairways. Check any carpeting to make sure that it is firmly attached to the stairs. Fix any carpet that is loose or worn. Avoid having throw rugs at the top or bottom of the stairs. If you do have throw rugs, attach them to the floor with carpet tape. Make sure that you have a light switch at the top of the  stairs and the bottom of the stairs. If you do not have them, ask someone to add them for you. What else can I do to help prevent falls? Wear shoes that: Do not have high heels. Have rubber bottoms. Are comfortable and fit you well. Are closed at the toe. Do not wear sandals. If you use a stepladder: Make sure that it is fully opened. Do not climb a closed stepladder. Make sure that both sides of the stepladder are locked into place. Ask someone to hold it for you, if possible. Clearly mark and make sure that you can see: Any grab bars or handrails. First and last steps. Where the edge of each step is. Use tools that help you move around (mobility aids) if they are needed. These include: Canes. Walkers. Scooters. Crutches. Turn on the lights when you go into a dark area.  Replace any light bulbs as soon as they burn out. Set up your furniture so you have a clear path. Avoid moving your furniture around. If any of your floors are uneven, fix them. If there are any pets around you, be aware of where they are. Review your medicines with your doctor. Some medicines can make you feel dizzy. This can increase your chance of falling. Ask your doctor what other things that you can do to help prevent falls. This information is not intended to replace advice given to you by your health care provider. Make sure you discuss any questions you have with your health care provider. Document Released: 07/16/2009 Document Revised: 02/25/2016 Document Reviewed: 10/24/2014 Elsevier Interactive Patient Education  2017 [employer].

## 2024-01-05 ENCOUNTER — Encounter: Payer: Self-pay | Admitting: Family Medicine

## 2024-01-05 ENCOUNTER — Other Ambulatory Visit (HOSPITAL_BASED_OUTPATIENT_CLINIC_OR_DEPARTMENT_OTHER): Payer: Self-pay

## 2024-01-05 ENCOUNTER — Ambulatory Visit (INDEPENDENT_AMBULATORY_CARE_PROVIDER_SITE_OTHER): Payer: PRIVATE HEALTH INSURANCE | Admitting: Family Medicine

## 2024-01-05 DIAGNOSIS — D649 Anemia, unspecified: Secondary | ICD-10-CM | POA: Diagnosis not present

## 2024-01-05 DIAGNOSIS — Z23 Encounter for immunization: Secondary | ICD-10-CM

## 2024-01-05 DIAGNOSIS — R7303 Prediabetes: Secondary | ICD-10-CM

## 2024-01-05 DIAGNOSIS — M8580 Other specified disorders of bone density and structure, unspecified site: Secondary | ICD-10-CM | POA: Diagnosis not present

## 2024-01-05 DIAGNOSIS — E782 Mixed hyperlipidemia: Secondary | ICD-10-CM | POA: Diagnosis not present

## 2024-01-05 DIAGNOSIS — Z0000 Encounter for general adult medical examination without abnormal findings: Secondary | ICD-10-CM

## 2024-01-05 DIAGNOSIS — M21611 Bunion of right foot: Secondary | ICD-10-CM

## 2024-01-05 DIAGNOSIS — M21612 Bunion of left foot: Secondary | ICD-10-CM

## 2024-01-05 LAB — CBC WITH DIFFERENTIAL/PLATELET
Basophils Absolute: 0 10*3/uL (ref 0.0–0.1)
Basophils Relative: 0.8 % (ref 0.0–3.0)
Eosinophils Absolute: 0.2 10*3/uL (ref 0.0–0.7)
Eosinophils Relative: 2.5 % (ref 0.0–5.0)
HCT: 36.3 % (ref 36.0–46.0)
Hemoglobin: 12.3 g/dL (ref 12.0–15.0)
Lymphocytes Relative: 36.2 % (ref 12.0–46.0)
Lymphs Abs: 2.2 10*3/uL (ref 0.7–4.0)
MCHC: 33.9 g/dL (ref 30.0–36.0)
MCV: 92.6 fl (ref 78.0–100.0)
Monocytes Absolute: 0.4 10*3/uL (ref 0.1–1.0)
Monocytes Relative: 6.4 % (ref 3.0–12.0)
Neutro Abs: 3.3 10*3/uL (ref 1.4–7.7)
Neutrophils Relative %: 54.1 % (ref 43.0–77.0)
Platelets: 314 10*3/uL (ref 150.0–400.0)
RBC: 3.92 Mil/uL (ref 3.87–5.11)
RDW: 13.8 % (ref 11.5–15.5)
WBC: 6.1 10*3/uL (ref 4.0–10.5)

## 2024-01-05 LAB — COMPREHENSIVE METABOLIC PANEL WITH GFR
ALT: 18 U/L (ref 0–35)
AST: 18 U/L (ref 0–37)
Albumin: 4.5 g/dL (ref 3.5–5.2)
Alkaline Phosphatase: 42 U/L (ref 39–117)
BUN: 18 mg/dL (ref 6–23)
CO2: 31 meq/L (ref 19–32)
Calcium: 9.2 mg/dL (ref 8.4–10.5)
Chloride: 101 meq/L (ref 96–112)
Creatinine, Ser: 0.79 mg/dL (ref 0.40–1.20)
GFR: 70.3 mL/min (ref >60.00)
Glucose, Bld: 97 mg/dL (ref 70–99)
Potassium: 3.9 meq/L (ref 3.5–5.1)
Sodium: 141 meq/L (ref 135–145)
Total Bilirubin: 0.5 mg/dL (ref 0.2–1.2)
Total Protein: 7 g/dL (ref 6.0–8.3)

## 2024-01-05 LAB — LIPID PANEL
Cholesterol: 170 mg/dL (ref 0–200)
HDL: 75.3 mg/dL (ref >39.00)
LDL Cholesterol: 83 mg/dL (ref 0–99)
NonHDL: 94.96
Total CHOL/HDL Ratio: 2
Triglycerides: 61 mg/dL (ref 0.0–149.0)
VLDL: 12.2 mg/dL (ref 0.0–40.0)

## 2024-01-05 LAB — HEMOGLOBIN A1C: Hgb A1c MFr Bld: 5.9 % (ref 4.6–6.5)

## 2024-01-05 MED ORDER — SIMVASTATIN 10 MG PO TABS
10.0000 mg | ORAL_TABLET | Freq: Every day | ORAL | 3 refills | Status: AC
  Filled 2024-01-05 – 2024-01-08 (×2): qty 60, 84d supply, fill #0
  Filled 2024-03-31: qty 60, 84d supply, fill #1
  Filled 2024-06-25: qty 60, 84d supply, fill #2
  Filled 2024-09-19: qty 60, 84d supply, fill #3

## 2024-01-05 NOTE — Patient Instructions (Addendum)
 Thank you for coming in today.  I will refer you to dietary to evaluate the bunions.  Bone density test was ordered for August.  If any concerns on labs I will let you know, continue same dose of cholesterol medication.  Take care!   Preventive Care 14 Years and Older, Female Preventive care refers to lifestyle choices and visits with your health care provider that can promote health and wellness. Preventive care visits are also called wellness exams. What can I expect for my preventive care visit? Counseling Your health care provider may ask you questions about your: Medical history, including: Past medical problems. Family medical history. Pregnancy and menstrual history. History of falls. Current health, including: Memory and ability to understand (cognition). Emotional well-being. Home life and relationship well-being. Sexual activity and sexual health. Lifestyle, including: Alcohol, nicotine or tobacco, and drug use. Access to firearms. Diet, exercise, and sleep habits. Work and work Astronomer. Sunscreen use. Safety issues such as seatbelt and bike helmet use. Physical exam Your health care provider will check your: Height and weight. These may be used to calculate your BMI (body mass index). BMI is a measurement that tells if you are at a healthy weight. Waist circumference. This measures the distance around your waistline. This measurement also tells if you are at a healthy weight and may help predict your risk of certain diseases, such as type 2 diabetes and high blood pressure. Heart rate and blood pressure. Body temperature. Skin for abnormal spots. What immunizations do I need?  Vaccines are usually given at various ages, according to a schedule. Your health care provider will recommend vaccines for you based on your age, medical history, and lifestyle or other factors, such as travel or where you work. What tests do I need? Screening Your health care provider may  recommend screening tests for certain conditions. This may include: Lipid and cholesterol levels. Hepatitis C test. Hepatitis B test. HIV (human immunodeficiency virus) test. STI (sexually transmitted infection) testing, if you are at risk. Lung cancer screening. Colorectal cancer screening. Diabetes screening. This is done by checking your blood sugar (glucose) after you have not eaten for a while (fasting). Mammogram. Talk with your health care provider about how often you should have regular mammograms. BRCA-related cancer screening. This may be done if you have a family history of breast, ovarian, tubal, or peritoneal cancers. Bone density scan. This is done to screen for osteoporosis. Talk with your health care provider about your test results, treatment options, and if necessary, the need for more tests. Follow these instructions at home: Eating and drinking  Eat a diet that includes fresh fruits and vegetables, whole grains, lean protein, and low-fat dairy products. Limit your intake of foods with high amounts of sugar, saturated fats, and salt. Take vitamin and mineral supplements as recommended by your health care provider. Do not drink alcohol if your health care provider tells you not to drink. If you drink alcohol: Limit how much you have to 0-1 drink a day. Know how much alcohol is in your drink. In the [place] one drink equals one 12 oz bottle of beer (355 mL), one 5 oz glass of wine (148 mL), or one 1 oz glass of hard liquor (44 mL). Lifestyle Brush your teeth every morning and night with fluoride toothpaste. Floss one time each day. Exercise for at least 30 minutes 5 or more days each week. Do not use any products that contain nicotine or tobacco. These products include cigarettes, chewing tobacco,  and vaping devices, such as e-cigarettes. If you need help quitting, ask your health care provider. Do not use drugs. If you are sexually active, practice safe sex. Use a condom  or other form of protection in order to prevent STIs. Take aspirin only as told by your health care provider. Make sure that you understand how much to take and what form to take. Work with your health care provider to find out whether it is safe and beneficial for you to take aspirin daily. Ask your health care provider if you need to take a cholesterol-lowering medicine (statin). Find healthy ways to manage stress, such as: Meditation, yoga, or listening to music. Journaling. Talking to a trusted person. Spending time with friends and family. Minimize exposure to UV radiation to reduce your risk of skin cancer. Safety Always wear your seat belt while driving or riding in a vehicle. Do not drive: If you have been drinking alcohol. Do not ride with someone who has been drinking. When you are tired or distracted. While texting. If you have been using any mind-altering substances or drugs. Wear a helmet and other protective equipment during sports activities. If you have firearms in your house, make sure you follow all gun safety procedures. What's next? Visit your health care provider once a year for an annual wellness visit. Ask your health care provider how often you should have your eyes and teeth checked. Stay up to date on all vaccines. This information is not intended to replace advice given to you by your health care provider. Make sure you discuss any questions you have with your health care provider. Document Revised: 03/17/2021 Document Reviewed: 03/17/2021 Elsevier Patient Education  2024 [employer].

## 2024-01-05 NOTE — Progress Notes (Signed)
 Subjective:  Patient ID: Kirsten Sullivan, female    DOB: [birth year]  Age: 83 y.o. MRN: [id number]  CC:  Chief Complaint  Patient presents with   Annual Exam    Patient states no concerns and no breakfast.     HPI Kirsten Sullivan presents for Annual Exam No health concerns or changes.   PCP, me Derm: Dr. Margo Aye,  appt in few weeks, yearly visits. Prior skin CA of leg.   Bunions on feet: New concern, left more than right - had for years. Left starting to turn out more, but no pain. Would like to meet with podiatry. Using special sock to hold in place.   Hyperlipidemia: Simvastatin 10 mg.  4 times per week when discussed at her last physical last year. 5 times per week now. No side effects at this dosing. Also on fish oil.   Lab Results  Component Value Date   CHOL 210 (H) 11/16/2022   HDL 76.20 11/16/2022   LDLCALC 120 (H) 11/16/2022   TRIG 71.0 11/16/2022   CHOLHDL 3 11/16/2022   Lab Results  Component Value Date   ALT 19 11/16/2022   AST 19 11/16/2022   ALKPHOS 40 11/16/2022   BILITOT 0.5 11/16/2022   Osteopenia On Citracal daily with calcium and vitamin D.  Last bone density in August 2023.  T-score -1.4 at the forearm radius.  Previously -1.3 in 2020.  Minimal change.  Also on iron supplement daily for history of anemia. Lab Results  Component Value Date   WBC 6.1 11/16/2022   HGB 13.0 11/16/2022   HCT 37.7 11/16/2022   MCV 92.2 11/16/2022   PLT 315.0 11/16/2022   Prediabetes: Last testing in 2022. No meds. Trying to eat healthy, some candy at times.  Walking for exercise.  Lab Results  Component Value Date   HGBA1C 5.8 11/20/2020   Wt Readings from Last 3 Encounters:  12/01/22 138 lb (62.6 kg)  11/16/22 138 lb 3.2 oz (62.7 kg)  05/12/22 136 lb (61.7 kg)       01/05/2024    9:49 AM 01/02/2024    2:36 PM 12/01/2022    8:56 AM 11/16/2022    9:27 AM 05/12/2022    9:02 AM  Depression screen PHQ 2/9  Decreased Interest 0 0 0 0 0  Down, Depressed,  Hopeless 0 0 0 0 0  PHQ - 2 Score 0 0 0 0 0  Altered sleeping 0 0     Tired, decreased energy 0 0     Change in appetite 0 0     Feeling bad or failure about yourself  0 0     Trouble concentrating 0 0     Moving slowly or fidgety/restless 0 0     Suicidal thoughts 0 0     PHQ-9 Score 0 0     Difficult doing work/chores Not difficult at all Not difficult at all       Health Maintenance  Topic Date Due   COVID-19 Vaccine (9 - 2024-25 season) 12/20/2023   MAMMOGRAM  01/23/2024   INFLUENZA VACCINE  05/03/2024   Medicare Annual Wellness (AWV)  01/01/2025   DTaP/Tdap/Td (3 - Td or Tdap) 04/18/2031   Pneumonia Vaccine 64+ Years old  Completed   DEXA SCAN  Completed   Zoster Vaccines- Shingrix  Completed   HPV VACCINES  Aged Out   Colonoscopy  Discontinued   Hepatitis C Screening  Discontinued  Mammogram April 2024, scheduled April 23 of  this year.    Immunization History  Administered Date(s) Administered   Fluad Quad(high Dose 65+) 05/23/2019, 06/22/2022   Fluad Trivalent(High Dose 65+) 06/22/2023   Influenza Split 07/11/2012, 08/04/2015   Influenza, High Dose Seasonal PF 08/04/2015   Influenza,inj,Quad PF,6+ Mos 07/05/2013, 07/25/2014   Influenza-Unspecified 07/22/2020, 07/20/2021   PFIZER Comirnaty(Gray Top)Covid-19 Tri-Sucrose Vaccine 03/19/2021   PFIZER(Purple Top)SARS-COV-2 Vaccination 10/15/2019, 11/04/2019, 05/12/2020   Pfizer Covid-19 Vaccine Bivalent Booster 41yrs & up 07/20/2021, 04/08/2022   Pfizer(Comirnaty)Fall Seasonal Vaccine 12 years and older 07/26/2022, 06/22/2023   Pneumococcal Conjugate-13 08/18/2014   Pneumococcal Polysaccharide-23 05/16/2013   Tdap 02/21/2012, 04/17/2021   Zoster Recombinant(Shingrix) 05/20/2020, 02/13/2022   Zoster, Live 10/04/2007  Pneumonia vaccination in 2014, 2015.  Discussed Prevnar - will have today.  RSV vaccine - considering at pharmacy.   No results found. Optho yearly  Dental: every 6 months.   Alcohol:none  Tobacco:  none  Exercise: joined gym. Infrequent visits, plans to increase exercise.    History Patient Active Problem List   Diagnosis Date Noted   H/O bone density study 05/16/2013   H/O hysterectomy for benign disease 03/11/2012   Anemia    Fibrocystic breast    Hyperlipidemia    Adenomatous polyps    Past Medical History:  Diagnosis Date   Adenomatous polyps    colonoscopy 07/07/2011 Dr. Hurman [place]   Anemia    Cataract    Fibrocystic breast    Hyperlipidemia    Past Surgical History:  Procedure Laterality Date   ABDOMINAL HYSTERECTOMY     BREAST BIOPSY     right- benign   COLONOSCOPY     EYE SURGERY  2012   Removal of cataracts   POLYPECTOMY     No Known Allergies Prior to Admission medications   Medication Sig Start Date End Date Taking? Authorizing Provider  Cyanocobalamin (VITAMIN B-12 CR PO) Take 5,000 mcg by mouth daily.    Yes [provider]  Multiple Vitamins-Minerals (CENTRUM SILVER PO) Take 1 tablet by mouth daily.   Yes [provider]  Omega-3 Fatty Acids (FISH OIL TRIPLE STRENGTH) 1400 MG CAPS Take 1,200 mg by mouth daily.    Yes [provider]  simvastatin (ZOCOR) 10 MG tablet Take 1 tablet (10 mg total) by mouth at bedtime on Mondays, Wednesdays, Fridays, and Saturdays. 09/07/23  Yes Shade Flood, MD  vitamin C (ASCORBIC ACID) 500 MG tablet Take 500 mg by mouth daily.   Yes [provider]  VITAMIN E PO Take 800 mg by mouth daily.   Yes [provider]  CALCIUM CARB-CHOLECALCIFEROL PO Take by mouth. 500mg -    [provider]   Social History   Socioeconomic History   Marital status: Single    Spouse name: Not on file   Number of children: Not on file   Years of education: Not on file   Highest education level: Master's degree (e.g., MA, MS, MEng, MEd, MSW, MBA)  Occupational History   Occupation: Retired  Tobacco Use   Smoking status: Never   Smokeless tobacco: Never   Vaping Use   Vaping status: Never Used  Substance and Sexual Activity   Alcohol use: Never   Drug use: No   Sexual activity: Not Currently  Other Topics Concern   Not on file  Social History Narrative   Not on file   Social Drivers of Health   Financial Resource Strain: Low Risk  (01/02/2024)   Overall Financial Resource Strain (CARDIA)  Difficulty of Paying Living Expenses: Not hard at all  Food Insecurity: No Food Insecurity (01/02/2024)   Hunger Vital Sign    Worried About Running Out of Food in the Last Year: Never true    Ran Out of Food in the Last Year: Never true  Transportation Needs: No Transportation Needs (01/02/2024)   PRAPARE - Administrator, Civil Service (Medical): No    Lack of Transportation (Non-Medical): No  Physical Activity: Insufficiently Active (01/02/2024)   Exercise Vital Sign    Days of Exercise per Week: 1 day    Minutes of Exercise per Session: 90 min  Stress: No Stress Concern Present (01/02/2024)   [employer] of Occupational Health - Occupational Stress Questionnaire    Feeling of Stress : Not at all  Social Connections: Moderately Integrated (01/02/2024)   Social Connection and Isolation Panel [NHANES]    Frequency of Communication with Friends and Family: More than three times a week    Frequency of Social Gatherings with Friends and Family: More than three times a week    Attends Religious Services: More than 4 times per year    Active Member of [employer] or Organizations: Yes    Attends Banker Meetings: More than 4 times per year    Marital Status: Never married  Intimate Partner Violence: Not At Risk (01/02/2024)   Humiliation, Afraid, Rape, and Kick questionnaire    Fear of Current or Ex-Partner: No    Emotionally Abused: No    Physically Abused: No    Sexually Abused: No    Review of Systems   Objective:  There were no vitals filed for this visit.   Physical Exam Constitutional:      Appearance: She is  well-developed.  HENT:     Head: Normocephalic and atraumatic.     Right Ear: External ear normal.     Left Ear: External ear normal.  Eyes:     Conjunctiva/sclera: Conjunctivae normal.     Pupils: Pupils are equal, round, and reactive to light.  Neck:     Thyroid: No thyromegaly.  Cardiovascular:     Rate and Rhythm: Normal rate and regular rhythm.     Heart sounds: Normal heart sounds. No murmur heard. Pulmonary:     Effort: Pulmonary effort is normal. No respiratory distress.     Breath sounds: Normal breath sounds. No wheezing.  Abdominal:     General: Bowel sounds are normal.     Palpations: Abdomen is soft.     Tenderness: There is no abdominal tenderness.  Musculoskeletal:        General: No tenderness. Normal range of motion.     Cervical back: Normal range of motion and neck supple.     Comments: Hallux valgus deformity left greater than right, nontender.  Elastic brace in place.  Slight hammertoe left second phalanx of bilateral feet, left greater than right.    Lymphadenopathy:     Cervical: No cervical adenopathy.  Skin:    General: Skin is warm and dry.     Findings: No rash.  Neurological:     Mental Status: She is alert and oriented to person, place, and time.  Psychiatric:        Behavior: Behavior normal.        Thought Content: Thought content normal.     Assessment & Plan:  Ladaysha Soutar is a 83 y.o. female . Annual physical exam  - -anticipatory guidance as below in AVS,  screening labs above. Health maintenance items as above in HPI discussed/recommended as applicable.   Mixed hyperlipidemia - Plan: Comprehensive metabolic panel with GFR, Lipid panel, simvastatin (ZOCOR) 10 MG tablet  - Tolerating current dose of statin, check labs and adjust plan accordingly.  Anemia, unspecified type - Plan: CBC with Differential/Platelet  - Check CBC, adjust plan accordingly.  Asymptomatic.  Osteopenia, unspecified location - Plan: DG Bone Density  -  Overall stable on last bone density, due for repeat bone density this fall, ordered.  Prediabetes - Plan: Hemoglobin A1c  - Check updated A1c, continue to watch diet, increased activity/exercise planned.  Need for pneumococcal vaccination - Plan: Pneumococcal conjugate vaccine 20-valent (Prevnar 20)  Bunion of left foot - Plan: Ambulatory referral to Podiatry Bunion, right foot - Plan: Ambulatory referral to Podiatry  - Not painful but would like to meet with podiatry to review treatment options.  Okay to continue bracing with elastic band, referred to podiatry.  Meds ordered this encounter  Medications   simvastatin (ZOCOR) 10 MG tablet    Sig: Take 1 tablet (10 mg total) by mouth at bedtime  5 days per week.    Dispense:  60 tablet    Refill:  3    TAKE 1 TABLET (10 MG TOTAL) BY MOUTH AT BEDTIME. TAKE ONE TABLET 5 days per week.   Patient Instructions  Thank you for coming in today.  I will refer you to dietary to evaluate the bunions.  Bone density test was ordered for August.  If any concerns on labs I will let you know, continue same dose of cholesterol medication.  Take care!   Preventive Care 81 Years and Older, Female Preventive care refers to lifestyle choices and visits with your health care provider that can promote health and wellness. Preventive care visits are also called wellness exams. What can I expect for my preventive care visit? Counseling Your health care provider may ask you questions about your: Medical history, including: Past medical problems. Family medical history. Pregnancy and menstrual history. History of falls. Current health, including: Memory and ability to understand (cognition). Emotional well-being. Home life and relationship well-being. Sexual activity and sexual health. Lifestyle, including: Alcohol, nicotine or tobacco, and drug use. Access to firearms. Diet, exercise, and sleep habits. Work and work Astronomer. Sunscreen use. Safety  issues such as seatbelt and bike helmet use. Physical exam Your health care provider will check your: Height and weight. These may be used to calculate your BMI (body mass index). BMI is a measurement that tells if you are at a healthy weight. Waist circumference. This measures the distance around your waistline. This measurement also tells if you are at a healthy weight and may help predict your risk of certain diseases, such as type 2 diabetes and high blood pressure. Heart rate and blood pressure. Body temperature. Skin for abnormal spots. What immunizations do I need?  Vaccines are usually given at various ages, according to a schedule. Your health care provider will recommend vaccines for you based on your age, medical history, and lifestyle or other factors, such as travel or where you work. What tests do I need? Screening Your health care provider may recommend screening tests for certain conditions. This may include: Lipid and cholesterol levels. Hepatitis C test. Hepatitis B test. HIV (human immunodeficiency virus) test. STI (sexually transmitted infection) testing, if you are at risk. Lung cancer screening. Colorectal cancer screening. Diabetes screening. This is done by checking your blood sugar (glucose) after you  have not eaten for a while (fasting). Mammogram. Talk with your health care provider about how often you should have regular mammograms. BRCA-related cancer screening. This may be done if you have a family history of breast, ovarian, tubal, or peritoneal cancers. Bone density scan. This is done to screen for osteoporosis. Talk with your health care provider about your test results, treatment options, and if necessary, the need for more tests. Follow these instructions at home: Eating and drinking  Eat a diet that includes fresh fruits and vegetables, whole grains, lean protein, and low-fat dairy products. Limit your intake of foods with high amounts of sugar,  saturated fats, and salt. Take vitamin and mineral supplements as recommended by your health care provider. Do not drink alcohol if your health care provider tells you not to drink. If you drink alcohol: Limit how much you have to 0-1 drink a day. Know how much alcohol is in your drink. In the [place] one drink equals one 12 oz bottle of beer (355 mL), one 5 oz glass of wine (148 mL), or one 1 oz glass of hard liquor (44 mL). Lifestyle Brush your teeth every morning and night with fluoride toothpaste. Floss one time each day. Exercise for at least 30 minutes 5 or more days each week. Do not use any products that contain nicotine or tobacco. These products include cigarettes, chewing tobacco, and vaping devices, such as e-cigarettes. If you need help quitting, ask your health care provider. Do not use drugs. If you are sexually active, practice safe sex. Use a condom or other form of protection in order to prevent STIs. Take aspirin only as told by your health care provider. Make sure that you understand how much to take and what form to take. Work with your health care provider to find out whether it is safe and beneficial for you to take aspirin daily. Ask your health care provider if you need to take a cholesterol-lowering medicine (statin). Find healthy ways to manage stress, such as: Meditation, yoga, or listening to music. Journaling. Talking to a trusted person. Spending time with friends and family. Minimize exposure to UV radiation to reduce your risk of skin cancer. Safety Always wear your seat belt while driving or riding in a vehicle. Do not drive: If you have been drinking alcohol. Do not ride with someone who has been drinking. When you are tired or distracted. While texting. If you have been using any mind-altering substances or drugs. Wear a helmet and other protective equipment during sports activities. If you have firearms in your house, make sure you follow all gun safety  procedures. What's next? Visit your health care provider once a year for an annual wellness visit. Ask your health care provider how often you should have your eyes and teeth checked. Stay up to date on all vaccines. This information is not intended to replace advice given to you by your health care provider. Make sure you discuss any questions you have with your health care provider. Document Revised: 03/17/2021 Document Reviewed: 03/17/2021 Elsevier Patient Education  2024 [employer]    Signed,   Meredith Staggers, MD [facility], [facility] 01/05/24 10:18 AM

## 2024-01-08 ENCOUNTER — Other Ambulatory Visit (HOSPITAL_BASED_OUTPATIENT_CLINIC_OR_DEPARTMENT_OTHER): Payer: Self-pay

## 2024-01-09 ENCOUNTER — Encounter: Payer: Self-pay | Admitting: Family Medicine

## 2024-01-09 ENCOUNTER — Encounter: Payer: Self-pay | Admitting: Podiatry

## 2024-01-09 ENCOUNTER — Ambulatory Visit: Admitting: Podiatry

## 2024-01-09 DIAGNOSIS — M21619 Bunion of unspecified foot: Secondary | ICD-10-CM

## 2024-01-09 NOTE — Patient Instructions (Signed)
 Bunion: What to Know A bunion, or hallux valgus, is a bump that forms slowly on the inner side of your big toe joint. It happens when your big toe turns toward your second toe. Bunions may be small at first but get bigger over time. They can make walking painful. What are the causes? A bunion may be caused by: Wearing narrow or pointed shoes that force your big toe to press against the other toes. Problems with how your foot is shaped. Changes in your foot caused by some diseases or conditions. A foot injury. What increases the risk? You're more likely to get a bunion if: You wear shoes that squeeze your toes. You have certain diseases, such as: Rheumatoid arthritis. Cerebral palsy. Someone in your family gets bunions too. You have flat feet or low arches. You do things that put a lot of pressure on your feet, such as ballet. What are the signs or symptoms? The main symptom is a bump on the inner side of your big toe. You may also have: Pain. Redness and swelling around your big toe. Thick or hard skin on your big toe or between your toes. Stiffness or loss of movement in your big toe. Trouble walking. How is this diagnosed? A bunion may be diagnosed based on your symptoms, medical history, and activities.  You may also have tests, such as an X-ray. This helps your health care provider see the bones in your foot and look for damage to your joint. How is this treated? Treatment can help with symptoms and can stop the bunion from getting worse. What you need to do may depend on how bad your symptoms are. You may need to: Wear shoes that have a wide toe box. Use bunion pads to cushion your toes. Tape your toes together. Place an insert called an orthotic device in your shoe. This can help take pressure off your toe joint. Take medicine to help with pain and swelling. Put ice or heat on your foot. Do stretching exercises. Have surgery. You may need this if the bunion is causing very  bad symptoms. Follow these instructions at home: Managing pain, stiffness, and swelling     Use ice or an ice pack as told. Place a towel between your skin and the ice. Leave the ice on for 20 minutes, 2-3 times a day. Use heat as told. Use the heat source that your provider recommends, such as a moist heat pack or a heating pad. Do this as often as told. Place a towel between your skin and the heat source. Leave the heat on for 20-30 minutes. If your skin turns red, take off the ice or heat right away to prevent skin damage. The risk of damage is higher if you can't feel pain, heat, or cold. General instructions Exercise as told. Support your toe joint as told with: The right footwear. Shoe padding. Taping. Wear shoes that have a wide toe box. Avoid wearing tight shoes or shoes with high heels. Take your medicines only as told. Do not smoke, vape, or use nicotine or tobacco. Keep all follow-up visits. Your provider will check if the treatments are working. Contact a health care provider if: Your symptoms get worse. Your symptoms don't get better in 2 weeks. Get help right away if: You have very bad pain and trouble walking. This information is not intended to replace advice given to you by your health care provider. Make sure you discuss any questions you have with your health  care provider. Document Revised: 04/07/2023 Document Reviewed: 04/07/2023 Elsevier Patient Education  2024 [employer].

## 2024-01-12 NOTE — Progress Notes (Signed)
  Subjective:  Patient ID: Kirsten Sullivan, female    DOB: [birth year],  MRN: [id number]  Chief Complaint  Patient presents with   Bunions    RM#13 Patient states not in any pain she states that the left foot bunion starting to curve doesn't want surgery because it it not causing pain.    Discussed the use of AI scribe software for clinical note transcription with the patient, who gave verbal consent to proceed.  History of Present Illness The patient presents with bilateral bunions, worse on the left, which have been progressively worsening over the past couple of years. She has been using bunion socks and reports no pain. She is concerned about the left bunion causing the second toe to cross over. She is seeking advice on how to prevent further progression of the bunions.  She is not interested in surgical intervention.      Objective:    Physical Exam General: AAO x3, NAD  Dermatological: Skin is warm, dry and supple bilateral. There are no open sores, no preulcerative lesions, no rash or signs of infection present.  Vascular: Dorsalis Pedis artery and Posterior Tibial artery pedal pulses are 2/4 bilateral with immedate capillary fill time.  There is no pain with calf compression, swelling, warmth, erythema.   Neruologic: Grossly intact via light touch bilateral. Vibratory intact via tuning fork bilateral.  No Babinski or clonus noted bilateral.   Musculoskeletal: On the left foot there is decreased range of motion of the first MTPJ.  Bunion deformities present with overlapping second toe.  There is no pain on exam.  There is no area pinpoint tenderness.  No edema.  MMT 5/5.  Gait: Unassisted, Nonantalgic.     No images are attached to the encounter.    Results     Assessment:   1. Bunion      Plan:  Patient was evaluated and treated and all questions answered.  Assessment and Plan Assessment & Plan Bunion Bilateral bunions, left more affected.  - Discussed  with service.  As well as conservative treatments. - Continue bunion socks and toe spacers. - Wear shoes with arch support and wide toe box. - Perform toe exercises like picking up marbles or towel bunching. - Return if symptoms worsen or pain develops.  Hammertoe Developing in left second toe, associated with bunion. No pain. Due to muscle imbalance and structural changes. - Perform toe exercises to maintain flexibility. - Return if changes occur or pain develops.   No follow-ups on file.   Vivi Barrack DPM

## 2024-01-24 ENCOUNTER — Ambulatory Visit
Admission: RE | Admit: 2024-01-24 | Discharge: 2024-01-24 | Disposition: A | Discharge status: Home / Self Care | Payer: PRIVATE HEALTH INSURANCE | Source: Ambulatory Visit | Attending: Family Medicine | Admitting: Family Medicine

## 2024-01-24 DIAGNOSIS — Z0000 Encounter for general adult medical examination without abnormal findings: Secondary | ICD-10-CM

## 2024-05-13 ENCOUNTER — Ambulatory Visit (HOSPITAL_BASED_OUTPATIENT_CLINIC_OR_DEPARTMENT_OTHER)
Admission: RE | Admit: 2024-05-13 | Discharge: 2024-05-13 | Disposition: A | Discharge status: Home / Self Care | Source: Ambulatory Visit | Attending: Family Medicine | Admitting: Family Medicine

## 2024-05-13 DIAGNOSIS — Z780 Asymptomatic menopausal state: Secondary | ICD-10-CM | POA: Insufficient documentation

## 2024-05-13 DIAGNOSIS — Z13820 Encounter for screening for osteoporosis: Secondary | ICD-10-CM | POA: Insufficient documentation

## 2024-05-13 DIAGNOSIS — M85832 Other specified disorders of bone density and structure, left forearm: Secondary | ICD-10-CM | POA: Diagnosis not present

## 2024-05-13 DIAGNOSIS — M8580 Other specified disorders of bone density and structure, unspecified site: Secondary | ICD-10-CM | POA: Insufficient documentation

## 2024-05-16 ENCOUNTER — Ambulatory Visit: Payer: Self-pay | Admitting: Family Medicine

## 2024-06-07 ENCOUNTER — Other Ambulatory Visit (HOSPITAL_BASED_OUTPATIENT_CLINIC_OR_DEPARTMENT_OTHER): Payer: Self-pay

## 2024-06-07 MED ORDER — FLUZONE HIGH-DOSE 0.5 ML IM SUSY
0.5000 mL | PREFILLED_SYRINGE | Freq: Once | INTRAMUSCULAR | 0 refills | Status: AC
  Filled 2024-06-07: qty 0.5, 1d supply, fill #0

## 2024-06-19 ENCOUNTER — Telehealth: Payer: Self-pay

## 2024-06-19 NOTE — Telephone Encounter (Signed)
 Copied from CRM [id number]. Topic: Clinical - Medication Question >> Jun 19, 2024  1:55 PM Taleah C wrote: Reason for CRM: pt called to request a rx for the covid vaccine pfizer to be sent to the [facility]. Please call and advise with an update.

## 2024-06-19 NOTE — Telephone Encounter (Signed)
 Called patient and she is aware that you no longer need a rx for COVID-19 vaccine

## 2024-06-25 ENCOUNTER — Other Ambulatory Visit (HOSPITAL_BASED_OUTPATIENT_CLINIC_OR_DEPARTMENT_OTHER): Payer: Self-pay

## 2024-06-25 MED ORDER — COMIRNATY 30 MCG/0.3ML IM SUSY
0.3000 mL | PREFILLED_SYRINGE | Freq: Once | INTRAMUSCULAR | 0 refills | Status: AC
  Filled 2024-06-25: qty 0.3, 1d supply, fill #0

## 2024-08-01 LAB — OPHTHALMOLOGY REPORT-SCANNED

## 2024-09-12 ENCOUNTER — Other Ambulatory Visit

## 2025-01-06 ENCOUNTER — Encounter: Admitting: Family Medicine
